# Patient Record
Sex: Female | Born: 1950 | Race: White | Hispanic: No | Smoking: Former smoker
Health system: Southern US, Community
[De-identification: ages and names within clinical notes are randomized; demographics above are authoritative.]

## PROBLEM LIST (undated history)

## (undated) DIAGNOSIS — I4891 Unspecified atrial fibrillation: Secondary | ICD-10-CM

## (undated) DIAGNOSIS — M8580 Other specified disorders of bone density and structure, unspecified site: Secondary | ICD-10-CM

## (undated) DIAGNOSIS — G729 Myopathy, unspecified: Secondary | ICD-10-CM

## (undated) DIAGNOSIS — C50919 Malignant neoplasm of unspecified site of unspecified female breast: Secondary | ICD-10-CM

## (undated) DIAGNOSIS — M3500 Sicca syndrome, unspecified: Secondary | ICD-10-CM

## (undated) DIAGNOSIS — G629 Polyneuropathy, unspecified: Secondary | ICD-10-CM

## (undated) DIAGNOSIS — Z950 Presence of cardiac pacemaker: Secondary | ICD-10-CM

## (undated) DIAGNOSIS — I442 Atrioventricular block, complete: Secondary | ICD-10-CM

## (undated) DIAGNOSIS — Z853 Personal history of malignant neoplasm of breast: Secondary | ICD-10-CM

## (undated) DIAGNOSIS — E785 Hyperlipidemia, unspecified: Secondary | ICD-10-CM

## (undated) DIAGNOSIS — K219 Gastro-esophageal reflux disease without esophagitis: Secondary | ICD-10-CM

## (undated) DIAGNOSIS — E119 Type 2 diabetes mellitus without complications: Secondary | ICD-10-CM

## (undated) HISTORY — PX: PARTIAL HYSTERECTOMY: SHX80

## (undated) HISTORY — PX: BREAST LUMPECTOMY: SHX2

## (undated) HISTORY — DX: Sjogren syndrome, unspecified: M35.00

## (undated) HISTORY — DX: Hyperlipidemia, unspecified: E78.5

## (undated) HISTORY — DX: Polyneuropathy, unspecified: G62.9

## (undated) HISTORY — DX: Type 2 diabetes mellitus without complications: E11.9

## (undated) HISTORY — DX: Atrioventricular block, complete: I44.2

## (undated) HISTORY — DX: Myopathy, unspecified: G72.9

## (undated) HISTORY — DX: Other specified disorders of bone density and structure, unspecified site: M85.80

## (undated) HISTORY — PX: PACEMAKER INSERTION: SHX728

## (undated) HISTORY — DX: Unspecified atrial fibrillation: I48.91

## (undated) HISTORY — DX: Gastro-esophageal reflux disease without esophagitis: K21.9

## (undated) HISTORY — DX: Presence of cardiac pacemaker: Z95.0

## (undated) HISTORY — DX: Personal history of malignant neoplasm of breast: Z85.3

## (undated) HISTORY — DX: Malignant neoplasm of unspecified site of unspecified female breast: C50.919

---

## 2000-09-14 DIAGNOSIS — R55 Syncope and collapse: Secondary | ICD-10-CM | POA: Insufficient documentation

## 2014-11-23 DIAGNOSIS — C50211 Malignant neoplasm of upper-inner quadrant of right female breast: Secondary | ICD-10-CM | POA: Insufficient documentation

## 2015-01-31 DIAGNOSIS — I959 Hypotension, unspecified: Secondary | ICD-10-CM | POA: Insufficient documentation

## 2015-05-02 DIAGNOSIS — Z9221 Personal history of antineoplastic chemotherapy: Secondary | ICD-10-CM | POA: Insufficient documentation

## 2015-05-02 DIAGNOSIS — I519 Heart disease, unspecified: Secondary | ICD-10-CM | POA: Insufficient documentation

## 2020-12-12 DIAGNOSIS — M8580 Other specified disorders of bone density and structure, unspecified site: Secondary | ICD-10-CM | POA: Insufficient documentation

## 2020-12-12 DIAGNOSIS — Z90711 Acquired absence of uterus with remaining cervical stump: Secondary | ICD-10-CM | POA: Insufficient documentation

## 2021-01-17 DIAGNOSIS — Z8616 Personal history of COVID-19: Secondary | ICD-10-CM | POA: Insufficient documentation

## 2021-07-23 ENCOUNTER — Ambulatory Visit: Payer: Self-pay | Admitting: Cardiovascular Disease

## 2021-08-12 ENCOUNTER — Ambulatory Visit (INDEPENDENT_AMBULATORY_CARE_PROVIDER_SITE_OTHER): Payer: Medicare Other | Admitting: Cardiovascular Disease

## 2021-08-12 ENCOUNTER — Encounter: Payer: Self-pay | Admitting: Cardiovascular Disease

## 2021-08-12 ENCOUNTER — Other Ambulatory Visit: Payer: Self-pay

## 2021-08-12 DIAGNOSIS — Z950 Presence of cardiac pacemaker: Secondary | ICD-10-CM | POA: Diagnosis not present

## 2021-08-12 DIAGNOSIS — E782 Mixed hyperlipidemia: Secondary | ICD-10-CM

## 2021-08-12 DIAGNOSIS — E785 Hyperlipidemia, unspecified: Secondary | ICD-10-CM | POA: Insufficient documentation

## 2021-08-12 NOTE — Progress Notes (Signed)
08/12/2021 Dorna Mai   02-10-1951  [id number]  Primary Physician No primary care provider on file. Primary Cardiologist: Lorretta Harp MD Lupe Carney, [place]  HPI:  Sylvia Gonzalez is a 70 y.o. thin and fit appearing divorced Caucasian female mother of 1 son, grandmother to 3 grandchildren all of whom live in [place] that was referred by Dr. Jaclyn Prime, her electrophysiologist, to be established in my practice for ongoing cardiovascular care.  She is retired from being in Scientist, research (life sciences) estate for 35 years.  She lived in [place] and relocated to [place] to be closer to her family.  Her brother-in-law, Jackson Latino, is a patient of mine as well.  Her cardiovascular risk factor profile is notable for hyperlipidemia intolerant to statin therapy.  Her father potentially had stents.  She is never had a heart attack or stroke.  She denies chest pain or shortness of breath.  She is fairly active and walks regularly without symptoms.  She apparently had a pacemaker placed in 2002 by Dr. Reeves Dam for complete heart block as well as a generator change in 2012 and again last month, October 2022.  Her 2D echo performed by her cardiologist 06/03/2021 was essentially normal.   Current Meds  Medication Sig   B Complex Vitamins (B COMPLEX 100 PO) Take by mouth.   BIOTIN PO Take by mouth.   Multiple Vitamins-Minerals (ULTRA FREEDA) TABS Take 1 tablet by mouth daily.   omeprazole (PRILOSEC) 40 MG capsule Take by mouth.   [DISCONTINUED] Benzocaine-Menthol 15-3.6 MG LOZG Take by mouth.   [DISCONTINUED] Calcium Carbonate Antacid 400 MG CHEW Chew by mouth.   [DISCONTINUED] ferrous sulfate 325 (65 FE) MG tablet Take by mouth.     Not on File  Social History   Socioeconomic History   Marital status: Divorced    Spouse name: Not on file   Number of children: Not on file   Years of education: Not on file   Highest education level: Not on file  Occupational History    Not on file  Tobacco Use   Smoking status: Never   Smokeless tobacco: Never  Substance and Sexual Activity   Alcohol use: Not on file    Comment: social   Drug use: Never   Sexual activity: Yes  Other Topics Concern   Not on file  Social History Narrative   Not on file   Social Determinants of Health   Financial Resource Strain: Not on file  Food Insecurity: Not on file  Transportation Needs: Not on file  Physical Activity: Not on file  Stress: Not on file  Social Connections: Not on file  Intimate Partner Violence: Not on file     Review of Systems: General: negative for chills, fever, night sweats or weight changes.  Cardiovascular: negative for chest pain, dyspnea on exertion, edema, orthopnea, palpitations, paroxysmal nocturnal dyspnea or shortness of breath Dermatological: negative for rash Respiratory: negative for cough or wheezing Urologic: negative for hematuria Abdominal: negative for nausea, vomiting, diarrhea, bright red blood per rectum, melena, or hematemesis Neurologic: negative for visual changes, syncope, or dizziness All other systems reviewed and are otherwise negative except as noted above.    Blood pressure 126/68, pulse 66, height 5\' 7"  (1.702 m), weight 140 lb 9.6 oz (63.8 kg), SpO2 97 %.  General appearance: alert and no distress Neck: no adenopathy, no carotid bruit, no JVD, supple, symmetrical, trachea midline, and thyroid not enlarged, symmetric, no tenderness/mass/nodules Lungs:  clear to auscultation bilaterally Heart: regular rate and rhythm, S1, S2 normal, no murmur, click, rub or gallop Extremities: extremities normal, atraumatic, no cyanosis or edema Pulses: 2+ and symmetric Skin: Skin color, texture, turgor normal. No rashes or lesions Neurologic: Grossly normal  EKG sinus rhythm at 66 with left bundle branch block.  I personally reviewed this EKG.  ASSESSMENT AND PLAN:   Hyperlipidemia History of hyperlipidemia intolerant to statin  therapy.  I am going to get a coronary calcium score to help determine how aggressive to be with lipid management.  I am referring her to a Pharm.D. in our practice to discuss PCSK9.  History of permanent cardiac pacemaker placement History of permanent transvenous pacemaker implanted in 2002 for complete heart block by Dr. Rosezella Florida.  She had a GEN change in 2012 and recently last month.  The wound is healed nicely.  She is currently in sinus rhythm.  2D echo performed in September revealed normal LV function with an EF of 53% with a septal wall motion abnormality consistent with pacing.  She is completely asymptomatic.  I am going to refer her to one of our EP doctors to get established for ongoing pacemaker care .     Lorretta Harp MD FACP,FACC,FAHA, [facility] 08/12/2021 10:38 AM

## 2021-08-12 NOTE — Addendum Note (Signed)
Addended by: Beatrix Fetters on: 08/12/2021 10:45 AM   Modules accepted: Orders

## 2021-08-12 NOTE — Assessment & Plan Note (Signed)
History of permanent transvenous pacemaker implanted in 2002 for complete heart block by Dr. Rosezella Florida.  She had a GEN change in 2012 and recently last month.  The wound is healed nicely.  She is currently in sinus rhythm.  2D echo performed in September revealed normal LV function with an EF of 53% with a septal wall motion abnormality consistent with pacing.  She is completely asymptomatic.  I am going to refer her to one of our EP doctors to get established for ongoing pacemaker care .

## 2021-08-12 NOTE — Patient Instructions (Signed)
Medication Instructions:  Your physician recommends that you continue on your current medications as directed. Please refer to the Current Medication list given to you today.  *If you need a refill on your cardiac medications before your next appointment, please call your pharmacy*   Lab Work: Your physician recommends that you have labs drawn today: Lipid/liver profile  If you have labs (blood work) drawn today and your tests are completely normal, you will receive your results only by: Philip (if you have MyChart) OR A paper copy in the mail If you have any lab test that is abnormal or we need to change your treatment, we will call you to review the results.   Testing/Procedures: Dr. Gwenlyn Found has ordered a CT coronary calcium score. This test is done at [address]. This is $99 out of pocket.   Coronary CalciumScan A coronary calcium scan is an imaging test used to look for deposits of calcium and other fatty materials (plaques) in the inner lining of the blood vessels of the heart (coronary arteries). These deposits of calcium and plaques can partly clog and narrow the coronary arteries without producing any symptoms or warning signs. This puts a person at risk for a heart attack. This test can detect these deposits before symptoms develop. Tell a health care provider about: Any allergies you have. All medicines you are taking, including vitamins, herbs, eye drops, creams, and over-the-counter medicines. Any problems you or family members have had with anesthetic medicines. Any blood disorders you have. Any surgeries you have had. Any medical conditions you have. Whether you are pregnant or may be pregnant. What are the risks? Generally, this is a safe procedure. However, problems may occur, including: Harm to a pregnant woman and her unborn baby. This test involves the use of radiation. Radiation exposure can be dangerous to a pregnant woman and her unborn  baby. If you are pregnant, you generally should not have this procedure done. Slight increase in the risk of cancer. This is because of the radiation involved in the test. What happens before the procedure? No preparation is needed for this procedure. What happens during the procedure? You will undress and remove any jewelry around your neck or chest. You will put on a hospital gown. Sticky electrodes will be placed on your chest. The electrodes will be connected to an electrocardiogram (ECG) machine to record a tracing of the electrical activity of your heart. A CT scanner will take pictures of your heart. During this time, you will be asked to lie still and hold your breath for 2-3 seconds while a picture of your heart is being taken. The procedure may vary among health care providers and hospitals. What happens after the procedure? You can get dressed. You can return to your normal activities. It is up to you to get the results of your test. Ask your health care provider, or the department that is doing the test, when your results will be ready. Summary A coronary calcium scan is an imaging test used to look for deposits of calcium and other fatty materials (plaques) in the inner lining of the blood vessels of the heart (coronary arteries). Generally, this is a safe procedure. Tell your health care provider if you are pregnant or may be pregnant. No preparation is needed for this procedure. A CT scanner will take pictures of your heart. You can return to your normal activities after the scan is done. This information is not intended to  replace advice given to you by your health care provider. Make sure you discuss any questions you have with your health care provider. Document Released: 02/27/2008 Document Revised: 07/20/2016 Document Reviewed: 07/20/2016 Elsevier Interactive Patient Education  2017 [place]: At [facility], you and your health needs are our  priority.  As part of our continuing mission to provide you with exceptional heart care, we have created designated Provider Care Teams.  These Care Teams include your primary Cardiologist (physician) and Advanced Practice Providers (APPs -  Physician Assistants and Nurse Practitioners) who all work together to provide you with the care you need, when you need it.  We recommend signing up for the patient portal called "MyChart".  Sign up information is provided on this After Visit Summary.  MyChart is used to connect with patients for Virtual Visits (Telemedicine).  Patients are able to view lab/test results, encounter notes, upcoming appointments, etc.  Non-urgent messages can be sent to your provider as well.   To learn more about what you can do with MyChart, go to [email or web address].    Your next appointment:   12 month(s)  The format for your next appointment:   In Person  Provider:   Quay Burow, MD  Other Instructions Pt needs an appointment with PharmD to discuss lipid management.

## 2021-08-12 NOTE — Assessment & Plan Note (Signed)
History of hyperlipidemia intolerant to statin therapy.  I am going to get a coronary calcium score to help determine how aggressive to be with lipid management.  I am referring her to a Pharm.D. in our practice to discuss PCSK9.

## 2021-08-13 LAB — LIPID PANEL
Chol/HDL Ratio: 7.2 ratio — ABNORMAL HIGH (ref 0.0–4.4)
Cholesterol, Total: 315 mg/dL — ABNORMAL HIGH (ref 100–199)
HDL: 44 mg/dL (ref >39)
LDL Chol Calc (NIH): 196 mg/dL — ABNORMAL HIGH (ref 0–99)
Triglycerides: 370 mg/dL — ABNORMAL HIGH (ref 0–149)
VLDL Cholesterol Cal: 75 mg/dL — ABNORMAL HIGH (ref 5–40)

## 2021-08-13 LAB — HEPATIC FUNCTION PANEL
ALT: 43 IU/L — ABNORMAL HIGH (ref 0–32)
AST: 33 IU/L (ref 0–40)
Albumin: 4.9 g/dL — ABNORMAL HIGH (ref 3.7–4.7)
Alkaline Phosphatase: 88 IU/L (ref 44–121)
Bilirubin Total: 0.3 mg/dL (ref 0.0–1.2)
Bilirubin, Direct: <0.1 mg/dL (ref 0.00–0.40)
Total Protein: 7.3 g/dL (ref 6.0–8.5)

## 2021-08-13 LAB — TSH+FREE T4
Free T4: 1.04 ng/dL (ref 0.82–1.77)
TSH: 2.09 u[IU]/mL (ref 0.450–4.500)

## 2021-09-03 ENCOUNTER — Ambulatory Visit (INDEPENDENT_AMBULATORY_CARE_PROVIDER_SITE_OTHER)
Admission: RE | Admit: 2021-09-03 | Discharge: 2021-09-03 | Disposition: A | Discharge status: Home / Self Care | Payer: Self-pay | Source: Ambulatory Visit | Attending: Cardiovascular Disease | Admitting: Cardiovascular Disease

## 2021-09-03 ENCOUNTER — Telehealth: Payer: Self-pay | Admitting: Pharmacist

## 2021-09-03 ENCOUNTER — Ambulatory Visit (INDEPENDENT_AMBULATORY_CARE_PROVIDER_SITE_OTHER): Payer: Medicare Other | Admitting: Pharmacist

## 2021-09-03 ENCOUNTER — Other Ambulatory Visit: Payer: Self-pay

## 2021-09-03 VITALS — BP 124/74 | HR 68 | Resp 16 | Ht 67.0 in | Wt 143.0 lb

## 2021-09-03 DIAGNOSIS — I779 Disorder of arteries and arterioles, unspecified: Secondary | ICD-10-CM | POA: Insufficient documentation

## 2021-09-03 DIAGNOSIS — E782 Mixed hyperlipidemia: Secondary | ICD-10-CM

## 2021-09-03 DIAGNOSIS — I2510 Atherosclerotic heart disease of native coronary artery without angina pectoris: Secondary | ICD-10-CM | POA: Insufficient documentation

## 2021-09-03 DIAGNOSIS — E7800 Pure hypercholesterolemia, unspecified: Secondary | ICD-10-CM | POA: Diagnosis not present

## 2021-09-03 DIAGNOSIS — K222 Esophageal obstruction: Secondary | ICD-10-CM | POA: Insufficient documentation

## 2021-09-03 DIAGNOSIS — I447 Left bundle-branch block, unspecified: Secondary | ICD-10-CM | POA: Insufficient documentation

## 2021-09-03 DIAGNOSIS — H9319 Tinnitus, unspecified ear: Secondary | ICD-10-CM | POA: Insufficient documentation

## 2021-09-03 DIAGNOSIS — K219 Gastro-esophageal reflux disease without esophagitis: Secondary | ICD-10-CM | POA: Insufficient documentation

## 2021-09-03 DIAGNOSIS — Z853 Personal history of malignant neoplasm of breast: Secondary | ICD-10-CM | POA: Insufficient documentation

## 2021-09-03 DIAGNOSIS — I480 Paroxysmal atrial fibrillation: Secondary | ICD-10-CM | POA: Insufficient documentation

## 2021-09-03 NOTE — Progress Notes (Signed)
Patient ID: Sylvia Gonzalez                 DOB: 1950-10-18                    MRN: [id number]     HPI: Solimar Maiden is a 70 y.o. female patient referred to lipid clinic by Dr Gwenlyn Found. PMH is significant for CAD, PAF, and HLD with statin intolerance. Recently moved here from [facility] and was seen by Dr Gwenlyn Found on 08/12/2021.  Had cardiac CT performed this morning however results are not back yet.  Patient presents today in good spirits.  Reports she has always had elevated LDL but does not know of any significant family history. Cardiologist in [place] was working on starting her on Kersey therapy but it did not occur before she moved to [place].    Has tried rosuvastatin, atorvastatin, and at least one other statin which name she does not remember.  All caused severe myalgias.    Reports she previously did most of her eating at home However since moving she has been eating out more.  Starts new Medicare plan in January 2023.  Current Medications: N/A  Intolerances:  Atorvastatin Rosuvastatin  Risk Factors: HLD, CAD  LDL goal: <70  Labs: TC 315, Trigs 370, HDL 44, LDL 196 (08/12/21 - not on any lipid lowering therapy)  No past medical history on file.  Current Outpatient Medications on File Prior to Visit  Medication Sig Dispense Refill   B Complex Vitamins (B COMPLEX 100 PO) Take by mouth.     BIOTIN PO Take by mouth.     Multiple Vitamins-Minerals (ULTRA FREEDA) TABS Take 1 tablet by mouth daily.     omeprazole (PRILOSEC) 40 MG capsule Take by mouth.     No current facility-administered medications on file prior to visit.    Not on File  Assessment/Plan:  1. Hyperlipidemia - Patient LDL 196 which is above goal of <70.  CAC scoring pending.  Unfortunately is intolerant to statins. Would not be an ideal candidate for Zetia at this time since she needs greater than a 50% reduction in LDL.  Recommend PCSK9i.  Using demo pen, educated patient on mechanism of action, storage,  site selection, administration, and possible adverse effects.  Patient was able to demonstrate in room using demo pen.  Requests PA completed when new Medicare plan becomes active. Recheck lipid panel in 2-3 months.  Karren Cobble, PharmD, BCACP, [place], [place], [place], [place], [address] Phone: [phone], Fax: [phone]

## 2021-09-03 NOTE — Telephone Encounter (Signed)
Please complete prior authorization for:  Name of medication, dose, and frequency Repatha 140mg  sq q 14 days or Praluent 75 mg sq q 14 days  Lab Orders Requested? yes  Which labs? Lipid panel  Estimated date for labs to be scheduled 2-3 months  Does patient need activated copay card? No  Please do PA after September 14 2021

## 2021-09-03 NOTE — Patient Instructions (Addendum)
It was nice meeting you today!  We would like to lower your LDL (bad cholesterol) to less than 70  We will start you on a new medication called Repatha or Praluent which you will inject once every 2 weeks  Once you start the medication we will recheck your cholesterol in 2-3 months  Please bring in your updated insurance card when it is active and we will complete the prior authorization for you and contact you when it is complete  Please give your insurance card to [facility]  Please give [place] a call with any questions!  Karren Cobble, PharmD, BCACP, [place], [place], [place], [place], [address] Phone: [phone], Fax: [phone]

## 2021-09-16 NOTE — Addendum Note (Signed)
Addended by: Allean Found on: 09/16/2021 08:56 AM   Modules accepted: Orders

## 2021-09-16 NOTE — Telephone Encounter (Signed)
Praluent pa submitted to cmm, lipi panel ordered, will contact pt when determination from insurance is made

## 2021-09-23 ENCOUNTER — Ambulatory Visit: Payer: Medicare HMO | Admitting: Cardiology

## 2021-09-23 ENCOUNTER — Other Ambulatory Visit: Payer: Self-pay

## 2021-09-23 ENCOUNTER — Encounter: Payer: Self-pay | Admitting: Cardiology

## 2021-09-23 VITALS — BP 116/66 | HR 70 | Ht 67.0 in | Wt 142.0 lb

## 2021-09-23 DIAGNOSIS — Z950 Presence of cardiac pacemaker: Secondary | ICD-10-CM | POA: Diagnosis not present

## 2021-09-23 DIAGNOSIS — I442 Atrioventricular block, complete: Secondary | ICD-10-CM | POA: Diagnosis not present

## 2021-09-23 LAB — PACEMAKER DEVICE OBSERVATION

## 2021-09-23 MED ORDER — PRALUENT 150 MG/ML ~~LOC~~ SOAJ: 150.0000 mg | SUBCUTANEOUS | 11 refills | Status: DC

## 2021-09-23 NOTE — Progress Notes (Signed)
Electrophysiology Office Note:    Date:  09/23/2021   ID:  Sylvia Gonzalez, DOB [birth year], MRN [id number]  PCP:  Michael Boston, MD  [facility] Cardiologist:  None  [facility] Electrophysiologist:  Vickie Epley, MD   Referring MD: Lorretta Harp, MD   Chief Complaint: Pacemaker  History of Present Illness:    Sylvia Gonzalez is a 71 y.o. female who presents for an evaluation of pacemaker at the request of Dr. Gwenlyn Found. Their medical history includes coronary artery disease, hyperlipidemia with statin intolerance.  She recently moved here from [facility].  In [place], she is not remotely monitored because of trouble with her transmitter. Today she tells me she has had no problems with her pacemaker.  The leads were originally implanted 2002 with a subsequent generator change in October 2022.    History reviewed. No pertinent past medical history.  History reviewed. No pertinent surgical history.  Current Medications: Current Meds  Medication Sig   Alirocumab (PRALUENT) 150 MG/ML SOAJ Inject 150 mg into the skin every 14 (fourteen) days.   B Complex Vitamins (B COMPLEX 100 PO) Take by mouth.   BIOTIN PO Take by mouth.   Multiple Vitamin (MULTIVITAMIN) capsule Take 1 capsule by mouth daily.   Multiple Vitamins-Minerals (HAIR SKIN AND NAILS FORMULA) TABS Take 2 tablets by mouth daily.   omeprazole (PRILOSEC) 40 MG capsule Take by mouth.   VITAMIN D, CHOLECALCIFEROL, PO Take 1 tablet by mouth daily.   Vitamin E 100 units TABS Take 1 tablet by mouth daily.     Allergies:   Hydrocodone-acetaminophen, Atorvastatin, Dopamine, and Rosuvastatin   Social History   Socioeconomic History   Marital status: Divorced    Spouse name: Not on file   Number of children: Not on file   Years of education: Not on file   Highest education level: Not on file  Occupational History   Not on file  Tobacco Use   Smoking status: Never   Smokeless tobacco: Never  Substance and  Sexual Activity   Alcohol use: Not on file    Comment: social   Drug use: Never   Sexual activity: Yes  Other Topics Concern   Not on file  Social History Narrative   Not on file   Social Determinants of Health   Financial Resource Strain: Not on file  Food Insecurity: Not on file  Transportation Needs: Not on file  Physical Activity: Not on file  Stress: Not on file  Social Connections: Not on file     Family History: The patient's family history is not on file.  ROS:   Please see the history of present illness.    All other systems reviewed and are negative.  EKGs/Labs/Other Studies Reviewed:    The following studies were reviewed today:  September 23, 2021 in clinic device interrogation personally reviewed Lead parameter stable with an elevated right ventricular lead capture threshold at 2.5 at 1 ms Battery longevity 8 to 11 years Ventricular pacing 10% Atrially pacing 4.6% AV delays are set at 200/170 but the underlying rhythm shows intact AV conduction but with a first-degree AV delay. Reprogrammed her AV delays to activate MVP in an effort to minimize ventricular pacing  EKG:  The ekg ordered today demonstrates sinus rhythm, QRS duration 122 ms   Recent Labs: 08/12/2021: ALT 43; TSH 2.090  Recent Lipid Panel    Component Value Date/Time   CHOL 315 (H) 08/12/2021 1052   TRIG 370 (H) 08/12/2021  1052   HDL 44 08/12/2021 1052   CHOLHDL 7.2 (H) 08/12/2021 1052   LDLCALC 196 (H) 08/12/2021 1052    Physical Exam:    VS:  BP 116/66    Pulse 70    Ht 5\' 7"  (1.702 m)    Wt 142 lb (64.4 kg)    SpO2 97%    BMI 22.24 kg/m     Wt Readings from Last 3 Encounters:  09/23/21 142 lb (64.4 kg)  09/03/21 143 lb (64.9 kg)  08/12/21 140 lb 9.6 oz (63.8 kg)     GEN:  Well nourished, well developed in no acute distress HEENT: Normal NECK: No JVD; No carotid bruits LYMPHATICS: No lymphadenopathy CARDIAC: RRR, no murmurs, rubs, gallops.  Pacemaker pocket  well-healed RESPIRATORY:  Clear to auscultation without rales, wheezing or rhonchi  ABDOMEN: Soft, non-tender, non-distended MUSCULOSKELETAL:  No edema; No deformity  SKIN: Warm and dry NEUROLOGIC:  Alert and oriented x 3 PSYCHIATRIC:  Normal affect       ASSESSMENT:    1. History of permanent cardiac pacemaker placement   2. Complete heart block (HCC)    PLAN:    In order of problems listed above:  The patient presents for establish care for her permanent pacemaker that was implanted 20 years ago in [place].  The device is functioning appropriately.  I did reprogram her AV delays today to minimize ventricular pacing.  Of note, the right ventricular pacing threshold is elevated at 2.5 at 1 ms.  We will establish in our clinic.  I will have the representative from [facility] reach out to the patient to discuss her remote monitoring equipment.  She will follow-up in our clinic in 9 months or sooner as needed.     Medication Adjustments/Labs and Tests Ordered: Current medicines are reviewed at length with the patient today.  Concerns regarding medicines are outlined above.  No orders of the defined types were placed in this encounter.  No orders of the defined types were placed in this encounter.    Signed, Hilton Cork. Quentin Ore, MD, [facility], [facility] 09/23/2021 9:30 PM    Electrophysiology [facility]

## 2021-09-23 NOTE — Telephone Encounter (Signed)
Lmom the pt that they were approved for the praluent, rxsent, instructed the pt to complete fasting labs post 4th dose and to call me to obtain their healthwell grant information Pharmacy Card CARD NO. [id number]   CARD STATUS Active   BIN [id number]   PCN [id number]   PC GROUP [id number]   HELP DESK [phone]   PROVIDER PDMI   PROCESSOR PDMI

## 2021-09-23 NOTE — Addendum Note (Signed)
Addended by: Allean Found on: 09/23/2021 03:44 PM   Modules accepted: Orders

## 2021-09-23 NOTE — Telephone Encounter (Signed)
Pt called to obtain healthwell grant information and it was provided. Pt voiced gratitude and understanding

## 2021-09-23 NOTE — Patient Instructions (Addendum)
Medication Instructions:  Your physician recommends that you continue on your current medications as directed. Please refer to the Current Medication list given to you today. *If you need a refill on your cardiac medications before your next appointment, please call your pharmacy*  Lab Work: None. If you have labs (blood work) drawn today and your tests are completely normal, you will receive your results only by: MyChart Message (if you have MyChart) OR A paper copy in the mail If you have any lab test that is abnormal or we need to change your treatment, we will call you to review the results.  Testing/Procedures: None.  Follow-Up: At [facility], you and your health needs are our priority.  As part of our continuing mission to provide you with exceptional heart care, we have created designated Provider Care Teams.  These Care Teams include your primary Cardiologist (physician) and Advanced Practice Providers (APPs -  Physician Assistants and Nurse Practitioners) who all work together to provide you with the care you need, when you need it.  Your physician wants you to follow-up in: 12 months with Cameron Lambert, MD     You will receive a reminder letter in the mail two months in advance. If you don't receive a letter, please call our office to schedule the follow-up appointment.  We recommend signing up for the patient portal called "MyChart".  Sign up information is provided on this After Visit Summary.  MyChart is used to connect with patients for Virtual Visits (Telemedicine).  Patients are able to view lab/test results, encounter notes, upcoming appointments, etc.  Non-urgent messages can be sent to your provider as well.   To learn more about what you can do with MyChart, go to [email or web address].    Any Other Special Instructions Will Be Listed Below (If Applicable).         

## 2021-09-29 LAB — CUP PACEART INCLINIC DEVICE CHECK
Battery Remaining Longevity: 135 mo
Battery Voltage: 3.02 V
Brady Statistic RA Percent Paced: 4.6 %
Brady Statistic RV Percent Paced: 10 %
Date Time Interrogation Session: 20230110160700
Implantable Pulse Generator Implant Date: 20221017
Lead Channel Impedance Value: 412.5 Ohm
Lead Channel Impedance Value: 725 Ohm
Lead Channel Pacing Threshold Amplitude: 0.75 V
Lead Channel Pacing Threshold Amplitude: 0.75 V
Lead Channel Pacing Threshold Amplitude: 2.25 V
Lead Channel Pacing Threshold Amplitude: 2.25 V
Lead Channel Pacing Threshold Pulse Width: 0.5 ms
Lead Channel Pacing Threshold Pulse Width: 0.5 ms
Lead Channel Pacing Threshold Pulse Width: 1 ms
Lead Channel Pacing Threshold Pulse Width: 1 ms
Lead Channel Sensing Intrinsic Amplitude: 2 mV
Lead Channel Sensing Intrinsic Amplitude: 2 mV
Lead Channel Setting Pacing Amplitude: 1.75 V
Lead Channel Setting Pacing Amplitude: 2.875
Lead Channel Setting Pacing Pulse Width: 1 ms
Lead Channel Setting Sensing Sensitivity: 1 mV
Pulse Gen Model: 2272
Pulse Gen Serial Number: 6462631

## 2021-11-19 DIAGNOSIS — Z853 Personal history of malignant neoplasm of breast: Secondary | ICD-10-CM | POA: Diagnosis not present

## 2021-11-19 DIAGNOSIS — Z1231 Encounter for screening mammogram for malignant neoplasm of breast: Secondary | ICD-10-CM | POA: Diagnosis not present

## 2021-11-28 ENCOUNTER — Other Ambulatory Visit: Payer: Self-pay

## 2021-11-28 DIAGNOSIS — E7800 Pure hypercholesterolemia, unspecified: Secondary | ICD-10-CM | POA: Diagnosis not present

## 2021-11-28 LAB — LIPID PANEL
Chol/HDL Ratio: 4.2 ratio (ref 0.0–4.4)
Cholesterol, Total: 175 mg/dL (ref 100–199)
HDL: 42 mg/dL (ref >39)
LDL Chol Calc (NIH): 94 mg/dL (ref 0–99)
Triglycerides: 228 mg/dL — ABNORMAL HIGH (ref 0–149)
VLDL Cholesterol Cal: 39 mg/dL (ref 5–40)

## 2021-12-08 DIAGNOSIS — R928 Other abnormal and inconclusive findings on diagnostic imaging of breast: Secondary | ICD-10-CM | POA: Diagnosis not present

## 2021-12-08 DIAGNOSIS — R921 Mammographic calcification found on diagnostic imaging of breast: Secondary | ICD-10-CM | POA: Diagnosis not present

## 2021-12-08 DIAGNOSIS — R922 Inconclusive mammogram: Secondary | ICD-10-CM | POA: Diagnosis not present

## 2021-12-22 DIAGNOSIS — D242 Benign neoplasm of left breast: Secondary | ICD-10-CM | POA: Diagnosis not present

## 2021-12-22 DIAGNOSIS — R921 Mammographic calcification found on diagnostic imaging of breast: Secondary | ICD-10-CM | POA: Diagnosis not present

## 2022-02-10 DIAGNOSIS — R7989 Other specified abnormal findings of blood chemistry: Secondary | ICD-10-CM | POA: Diagnosis not present

## 2022-02-10 DIAGNOSIS — E785 Hyperlipidemia, unspecified: Secondary | ICD-10-CM | POA: Diagnosis not present

## 2022-02-10 DIAGNOSIS — M8580 Other specified disorders of bone density and structure, unspecified site: Secondary | ICD-10-CM | POA: Diagnosis not present

## 2022-02-24 DIAGNOSIS — Z0000 Encounter for general adult medical examination without abnormal findings: Secondary | ICD-10-CM | POA: Diagnosis not present

## 2022-02-24 DIAGNOSIS — E785 Hyperlipidemia, unspecified: Secondary | ICD-10-CM | POA: Diagnosis not present

## 2022-02-24 DIAGNOSIS — G729 Myopathy, unspecified: Secondary | ICD-10-CM | POA: Diagnosis not present

## 2022-02-24 DIAGNOSIS — M3500 Sicca syndrome, unspecified: Secondary | ICD-10-CM | POA: Diagnosis not present

## 2022-02-24 DIAGNOSIS — M8580 Other specified disorders of bone density and structure, unspecified site: Secondary | ICD-10-CM | POA: Diagnosis not present

## 2022-02-24 DIAGNOSIS — I480 Paroxysmal atrial fibrillation: Secondary | ICD-10-CM | POA: Diagnosis not present

## 2022-02-24 DIAGNOSIS — K219 Gastro-esophageal reflux disease without esophagitis: Secondary | ICD-10-CM | POA: Diagnosis not present

## 2022-02-24 DIAGNOSIS — Z853 Personal history of malignant neoplasm of breast: Secondary | ICD-10-CM | POA: Diagnosis not present

## 2022-02-24 DIAGNOSIS — Z1159 Encounter for screening for other viral diseases: Secondary | ICD-10-CM | POA: Diagnosis not present

## 2022-02-24 DIAGNOSIS — R7303 Prediabetes: Secondary | ICD-10-CM | POA: Diagnosis not present

## 2022-02-24 DIAGNOSIS — G609 Hereditary and idiopathic neuropathy, unspecified: Secondary | ICD-10-CM | POA: Diagnosis not present

## 2022-02-24 DIAGNOSIS — Z1339 Encounter for screening examination for other mental health and behavioral disorders: Secondary | ICD-10-CM | POA: Diagnosis not present

## 2022-02-24 DIAGNOSIS — Z1331 Encounter for screening for depression: Secondary | ICD-10-CM | POA: Diagnosis not present

## 2022-03-09 DIAGNOSIS — G729 Myopathy, unspecified: Secondary | ICD-10-CM | POA: Diagnosis not present

## 2022-03-09 DIAGNOSIS — R42 Dizziness and giddiness: Secondary | ICD-10-CM | POA: Diagnosis not present

## 2022-03-09 DIAGNOSIS — B001 Herpesviral vesicular dermatitis: Secondary | ICD-10-CM | POA: Diagnosis not present

## 2022-03-09 DIAGNOSIS — M542 Cervicalgia: Secondary | ICD-10-CM | POA: Diagnosis not present

## 2022-03-09 DIAGNOSIS — I480 Paroxysmal atrial fibrillation: Secondary | ICD-10-CM | POA: Diagnosis not present

## 2022-03-09 DIAGNOSIS — M3500 Sicca syndrome, unspecified: Secondary | ICD-10-CM | POA: Diagnosis not present

## 2022-04-21 ENCOUNTER — Encounter: Payer: Self-pay | Admitting: Gastroenterology

## 2022-04-27 DIAGNOSIS — D1801 Hemangioma of skin and subcutaneous tissue: Secondary | ICD-10-CM | POA: Diagnosis not present

## 2022-04-27 DIAGNOSIS — X32XXXA Exposure to sunlight, initial encounter: Secondary | ICD-10-CM | POA: Diagnosis not present

## 2022-04-27 DIAGNOSIS — C44612 Basal cell carcinoma of skin of right upper limb, including shoulder: Secondary | ICD-10-CM | POA: Diagnosis not present

## 2022-04-27 DIAGNOSIS — D225 Melanocytic nevi of trunk: Secondary | ICD-10-CM | POA: Diagnosis not present

## 2022-04-27 DIAGNOSIS — L821 Other seborrheic keratosis: Secondary | ICD-10-CM | POA: Diagnosis not present

## 2022-04-27 DIAGNOSIS — L570 Actinic keratosis: Secondary | ICD-10-CM | POA: Diagnosis not present

## 2022-05-20 DIAGNOSIS — G629 Polyneuropathy, unspecified: Secondary | ICD-10-CM | POA: Insufficient documentation

## 2022-05-20 DIAGNOSIS — Z923 Personal history of irradiation: Secondary | ICD-10-CM | POA: Insufficient documentation

## 2022-05-21 ENCOUNTER — Ambulatory Visit (INDEPENDENT_AMBULATORY_CARE_PROVIDER_SITE_OTHER): Payer: Medicare HMO | Admitting: Gastroenterology

## 2022-05-21 ENCOUNTER — Encounter: Payer: Self-pay | Admitting: Gastroenterology

## 2022-05-21 VITALS — BP 124/68 | HR 62 | Ht 67.0 in | Wt 139.0 lb

## 2022-05-21 DIAGNOSIS — R1319 Other dysphagia: Secondary | ICD-10-CM | POA: Diagnosis not present

## 2022-05-21 NOTE — Progress Notes (Signed)
[facility] Consult Note:  History: Sylvia Gonzalez 05/21/2022  Referring provider: Michael Boston, MD  Reason for consult/chief complaint: Dysphagia (Patient states she watches what she eats)   Subjective  HPI: Sylvia Gonzalez was referred by her primary care provider for dysphagia.  She reports episodic dysphagia with 2 prior endoscopic dilations by her GI physician in [place] prior to moving to this area. She recently had an episode of what sounds like a brief food impaction while eating in a restaurant, after which she contacted Dr. Jacalyn Gonzalez and was scheduled to see [place].  Limited had been taking omeprazole daily for years, then within about the last 2 months decided to wean off it.  She is now just taking it occasionally if she has heartburn.  She was doing well until the episode 4 to 6 weeks ago where everything got stuck so she started her breakfast, nothing would pass and she had to go to the bathroom bring her back.  Since then she has been much more careful with no recurrent problems. She had 2 upper endoscopies in [place] before and also what sounds like a barium study.  Appetite has been good and weight stable. [name] reports a normal colonoscopy in the last several years. ROS:  Review of Systems  Constitutional:  Negative for appetite change and unexpected weight change.  HENT:  Negative for mouth sores and voice change.   Eyes:  Negative for pain and redness.  Respiratory:  Negative for cough and shortness of breath.   Cardiovascular:  Negative for chest pain and palpitations.  Genitourinary:  Negative for dysuria and hematuria.  Musculoskeletal:  Negative for arthralgias and myalgias.  Skin:  Negative for pallor and rash.  Neurological:  Negative for weakness and headaches.       Neuropathy from chemotherapy  Hematological:  Negative for adenopathy.   Also positional dizziness  Past Medical History: Past Medical History:  Diagnosis Date   Atrial fibrillation  ([place])    Breast cancer ([place])    Complete heart block (HCC)    GERD (gastroesophageal reflux disease)    History of breast cancer    Hyperlipidemia    Myopathy    Osteopenia    Pacemaker    Peripheral neuropathy    Sicca syndrome [facility])    Nov 2022 Cardiology office note Sylvia Gonzalez) has the following: "HPI:  Sylvia Gonzalez is a 71 y.o. thin and fit appearing divorced Caucasian female mother of 1 son, grandmother to 3 grandchildren all of whom live in [place] that was referred by Sylvia Gonzalez, her electrophysiologist, to be established in my practice for ongoing cardiovascular care.  She is retired from being in Scientist, research (life sciences) estate for 35 years.  She lived in [place] and relocated to [place] to be closer to her family.  Her brother-in-law, Sylvia Gonzalez, is a patient of mine as well.  Her cardiovascular risk factor profile is notable for hyperlipidemia intolerant to statin therapy.  Her father potentially had stents.  She is never had a heart attack or stroke.  She denies chest pain or shortness of breath.  She is fairly active and walks regularly without symptoms.  She apparently had a pacemaker placed in 2002 by Dr. Reeves Dam for complete heart block as well as a generator change in 2012 and again last month, October 2022.  Her 2D echo performed by her cardiologist 06/03/2021 was essentially normal."     Past Surgical History: Past Surgical History:  Procedure Laterality Date  BREAST LUMPECTOMY     PACEMAKER INSERTION     PARTIAL HYSTERECTOMY       Family History: Family History  Problem Relation Age of Onset   Dementia Mother    Diabetes Father    Breast cancer Sister    Diabetes Paternal Grandfather    Colon cancer Neg Hx    Stomach cancer Neg Hx    Esophageal cancer Neg Hx    Colon polyps Neg Hx     Social History: Social History   Socioeconomic History   Marital status: Divorced    Spouse name: Not on file   Number of children: 1   Years  of education: Not on file   Highest education level: Not on file  Occupational History   Occupation: retired  Tobacco Use   Smoking status: Former    Types: Cigarettes   Smokeless tobacco: Never  Scientific laboratory technician Use: Never used  Substance and Sexual Activity   Alcohol use: Yes    Comment: social   Drug use: Never   Sexual activity: Yes  Other Topics Concern   Not on file  Social History Narrative   Not on file   Social Determinants of Health   Financial Resource Strain: Not on file  Food Insecurity: Not on file  Transportation Needs: Not on file  Physical Activity: Not on file  Stress: Not on file  Social Connections: Not on file    Allergies: Allergies  Allergen Reactions   Hydrocodone-Acetaminophen     Other reaction(s): Unknown   Atorvastatin     Other reaction(s): Myalgia   Dopamine Nausea And Vomiting    Other reaction(s): Unknown   Rosuvastatin     Other reaction(s): Myalgia    Outpatient Meds: Current Outpatient Medications  Medication Sig Dispense Refill   Alirocumab (PRALUENT) 150 MG/ML SOAJ Inject 150 mg into the skin every 14 (fourteen) days. 2 mL 11   CALCIUM PO Take by mouth.     Multiple Vitamin (MULTIVITAMIN) capsule Take 1 capsule by mouth daily.     Multiple Vitamins-Minerals (HAIR SKIN AND NAILS FORMULA) TABS Take 2 tablets by mouth daily.     omeprazole (PRILOSEC) 40 MG capsule Take by mouth.     Vitamin E 100 units TABS Take 1 tablet by mouth daily.     B Complex Vitamins (B COMPLEX 100 PO) Take by mouth. (Patient not taking: Reported on 05/21/2022)     BIOTIN PO Take by mouth. (Patient not taking: Reported on 05/21/2022)     VITAMIN D, CHOLECALCIFEROL, PO Take 1 tablet by mouth daily. (Patient not taking: Reported on 05/21/2022)     No current facility-administered medications for this visit.      ___________________________________________________________________ Objective   Exam:  BP 124/68   Pulse 62   Ht '5\' 7"'$  (1.702 m)   Wt  139 lb (63 kg)   SpO2 97%   BMI 21.77 kg/m  Wt Readings from Last 3 Encounters:  05/21/22 139 lb (63 kg)  09/23/21 142 lb (64.4 kg)  09/03/21 143 lb (64.9 kg)    General: Well-appearing, normal vocal quality.  Normal muscle mass Eyes: sclera anicteric, no redness ENT: oral mucosa moist without lesions, no cervical or supraclavicular lymphadenopathy CV: Regular without murmur, no JVD, no peripheral edema.  Pacer left upper chest wall Resp: clear to auscultation bilaterally, normal RR and effort noted GI: soft, no tenderness, with active bowel sounds. No guarding or palpable organomegaly noted. Skin; warm and dry, no  rash or jaundice noted Neuro: awake, alert and oriented x 3. Normal gross motor function and fluent speech  Labs: Normal CBC, CMP and TSH on 02/10/2022  Assessment: Encounter Diagnosis  Name Primary?   Esophageal dysphagia Yes    Possible history of stricture and/or motility disorder, reported previous endoscopic dilation.  She was doing well until her recent episode of what sounds and a brief food impaction. There may not be much underlying GERD since she has been able to wean off regular use of PPI lately. Plan:  Upper endoscopy with possible dilation.  Rule out stricture, ring, achalasia. She was agreeable after discussion of procedure and risks.  The benefits and risks of the planned procedure were described in detail with the patient or (when appropriate) their health care proxy.  Risks were outlined as including, but not limited to, bleeding, infection, perforation, adverse medication reaction leading to cardiac or pulmonary decompensation, pancreatitis (if ERCP).  The limitation of incomplete mucosal visualization was also discussed.  No guarantees or warranties were given.   Thank you for the courtesy of this consult.  Please call me with any questions or concerns.  Nelida Meuse III  CC: Referring provider noted above

## 2022-05-21 NOTE — Patient Instructions (Signed)
_______________________________________________________  If you are age 72 or older, your body mass index should be between 23-30. Your Body mass index is 21.77 kg/m. If this is out of the aforementioned range listed, please consider follow up with your Primary Care Provider.  If you are age 57 or younger, your body mass index should be between 19-25. Your Body mass index is 21.77 kg/m. If this is out of the aformentioned range listed, please consider follow up with your Primary Care Provider.   ________________________________________________________  The [facility] providers would like to encourage you to use [facility] to communicate with providers for non-urgent requests or questions.  Due to long hold times on the telephone, sending your provider a message by [facility] may be a faster and more efficient way to get a response.  Please allow 48 business hours for a response.  Please remember that this is for non-urgent requests.  _______________________________________________________  Dennis Bast have been scheduled for an endoscopy. Please follow written instructions given to you at your visit today. If you use inhalers (even only as needed), please bring them with you on the day of your procedure.  Due to recent changes in healthcare laws, you may see the results of your imaging and laboratory studies on MyChart before your provider has had a chance to review them.  We understand that in some cases there may be results that are confusing or concerning to you. Not all laboratory results come back in the same time frame and the provider may be waiting for multiple results in order to interpret others.  Please give [place] 48 hours in order for your provider to thoroughly review all the results before contacting the office for clarification of your results.   It was a pleasure to see you today!  Thank you for trusting me with your gastrointestinal care!

## 2022-05-26 ENCOUNTER — Ambulatory Visit (AMBULATORY_SURGERY_CENTER): Payer: Medicare HMO | Admitting: Gastroenterology

## 2022-05-26 ENCOUNTER — Encounter: Payer: Self-pay | Admitting: Gastroenterology

## 2022-05-26 VITALS — BP 143/62 | HR 62 | Temp 97.8°F | Resp 16 | Ht 67.0 in | Wt 139.0 lb

## 2022-05-26 DIAGNOSIS — K317 Polyp of stomach and duodenum: Secondary | ICD-10-CM

## 2022-05-26 DIAGNOSIS — K31819 Angiodysplasia of stomach and duodenum without bleeding: Secondary | ICD-10-CM | POA: Diagnosis not present

## 2022-05-26 DIAGNOSIS — R1319 Other dysphagia: Secondary | ICD-10-CM

## 2022-05-26 MED ORDER — SODIUM CHLORIDE 0.9 % IV SOLN: 500.0000 mL | Freq: Once | INTRAVENOUS | Status: DC

## 2022-05-26 NOTE — Progress Notes (Signed)
No changes to clinical history since GI office visit on 05/21/22.  The patient is appropriate for an endoscopic procedure in the ambulatory setting.  - Wilfrid Lund, MD

## 2022-05-26 NOTE — Op Note (Signed)
[facility] Patient Name: Sylvia Gonzalez Procedure Date: 05/26/2022 7:57 AM MRN: [id number] Endoscopist: Mallie Mussel L. Loletha Carrow , MD Age: 71 Referring MD:  Date of Birth: 1951/04/25 Gender: Female Account #: [id number] Procedure:                Upper GI endoscopy Indications:              Esophageal dysphagia (see recent office consult                            note for details) Medicines:                Monitored Anesthesia Care Procedure:                Pre-Anesthesia Assessment:                           - Prior to the procedure, a History and Physical                            was performed, and patient medications and                            allergies were reviewed. The patient's tolerance of                            previous anesthesia was also reviewed. The risks                            and benefits of the procedure and the sedation                            options and risks were discussed with the patient.                            All questions were answered, and informed consent                            was obtained. Prior Anticoagulants: The patient has                            taken no previous anticoagulant or antiplatelet                            agents. ASA Grade Assessment: II - A patient with                            mild systemic disease. After reviewing the risks                            and benefits, the patient was deemed in                            satisfactory condition to undergo the procedure.  After obtaining informed consent, the endoscope was                            passed under direct vision. Throughout the                            procedure, the patient's blood pressure, pulse, and                            oxygen saturations were monitored continuously. The                            Endoscope was introduced through the mouth, and                            advanced to the second part of duodenum. The  upper                            GI endoscopy was accomplished without difficulty.                            The patient tolerated the procedure well. Scope In: Scope Out: Findings:                 There is no endoscopic evidence of Barrett's                            esophagus, esophagitis, hiatal hernia, mucosal                            abnormalities or stricture in the entire esophagus.                            No resistance passing scope through the UES or LES                            and no dilatation noted.                           The esophagus was therefore normal.                           Multiple sessile fundic gland polyps were found in                            the gastric fundus and in the gastric body.                           The exam of the stomach was otherwise normal.                           The cardia and gastric fundus were normal on  retroflexion.                           A few small angioectasias without bleeding were                            found in the second portion of the duodenum.                           The exam of the duodenum was otherwise normal. Complications:            No immediate complications. Estimated Blood Loss:     Estimated blood loss: none. Impression:               - Normal esophagus.                           - Multiple fundic gland polyps.                           - A few non-bleeding angioectasias in the duodenum.                           - No specimens collected.                           Since no stricture or mucosal abnormalities were                            seen, the recent episode of food impaction (which                            was the only episode in years) sounds like                            esophageal spasm. Recommendation:           - Patient has a contact number available for                            emergencies. The signs and symptoms of potential                             delayed complications were discussed with the                            patient. Return to normal activities tomorrow.                            Written discharge instructions were provided to the                            patient.                           - Resume previous diet.                           -  Continue present medications. PPI can be taken as                            needed for heartburn.                           - Contact me if there is recurrent dysphagia, and                            an esophageal manometry study will be scheduled. Ikesha Siller L. Loletha Carrow, MD 05/26/2022 8:15:44 AM This report has been signed electronically.

## 2022-05-26 NOTE — Progress Notes (Signed)
Pt's states no medical or surgical changes since previsit or office visit. 

## 2022-05-26 NOTE — Progress Notes (Signed)
A and O x3. Report to RN. Tolerated MAC anesthesia well.Teeth unchanged after procedure. 

## 2022-05-26 NOTE — Patient Instructions (Addendum)
Resume previous diet and continue present medications.  PPI can be taken as needed for heartburn. Contact Dr. Loletha Carrow if there is recurrent dysphagia, and an esophageal manometry study will be scheduled.   YOU HAD AN ENDOSCOPIC PROCEDURE TODAY AT [facility]:   Refer to the procedure report that was given to you for any specific questions about what was found during the examination.  If the procedure report does not answer your questions, please call your gastroenterologist to clarify.  If you requested that your care partner not be given the details of your procedure findings, then the procedure report has been included in a sealed envelope for you to review at your convenience later.  YOU SHOULD EXPECT: Some feelings of bloating in the abdomen. Passage of more gas than usual.  Walking can help get rid of the air that was put into your GI tract during the procedure and reduce the bloating. If you had a lower endoscopy (such as a colonoscopy or flexible sigmoidoscopy) you may notice spotting of blood in your stool or on the toilet paper. If you underwent a bowel prep for your procedure, you may not have a normal bowel movement for a few days.  Please Note:  You might notice some irritation and congestion in your nose or some drainage.  This is from the oxygen used during your procedure.  There is no need for concern and it should clear up in a day or so.  SYMPTOMS TO REPORT IMMEDIATELY:  Following upper endoscopy (EGD)  Vomiting of blood or coffee ground material  New chest pain or pain under the shoulder blades  Painful or persistently difficult swallowing  New shortness of breath  Fever of 100F or higher  Black, tarry-looking stools  For urgent or emergent issues, a gastroenterologist can be reached at any hour by calling [phone]. Do not use MyChart messaging for urgent concerns.    DIET:  We do recommend a small meal at first, but then you may proceed to your  regular diet.  Drink plenty of fluids but you should avoid alcoholic beverages for 24 hours.  ACTIVITY:  You should plan to take it easy for the rest of today and you should NOT DRIVE or use heavy machinery until tomorrow (because of the sedation medicines used during the test).    FOLLOW UP: Our staff will call the number listed on your records the next business day following your procedure.  We will call around 7:15- 8:00 am to check on you and address any questions or concerns that you may have regarding the information given to you following your procedure. If we do not reach you, we will leave a message.     If any biopsies were taken you will be contacted by phone or by letter within the next 1-3 weeks.  Please call us at [phone] if you have not heard about the biopsies in 3 weeks.    SIGNATURES/CONFIDENTIALITY: You and/or your care partner have signed paperwork which will be entered into your electronic medical record.  These signatures attest to the fact that that the information above on your After Visit Summary has been reviewed and is understood.  Full responsibility of the confidentiality of this discharge information lies with you and/or your care-partner.

## 2022-05-27 ENCOUNTER — Telehealth: Payer: Self-pay

## 2022-05-27 NOTE — Telephone Encounter (Signed)
  Follow up Call-     05/26/2022    7:19 AM  Call back number  Post procedure Call Back phone  # [phone]  Permission to leave phone message Yes     Patient questions:  Do you have a fever, pain , or abdominal swelling? No. Pain Score  0 *  Have you tolerated food without any problems? Yes.    Have you been able to return to your normal activities? Yes.    Do you have any questions about your discharge instructions: Diet   No. Medications  No. Follow up visit  No.  Do you have questions or concerns about your Care? No.  Actions: * If pain score is 4 or above: No action needed, pain <4.

## 2022-08-03 ENCOUNTER — Encounter: Payer: Self-pay | Admitting: Cardiovascular Disease

## 2022-08-21 ENCOUNTER — Other Ambulatory Visit: Payer: Self-pay | Admitting: Cardiovascular Disease

## 2022-08-21 DIAGNOSIS — I779 Disorder of arteries and arterioles, unspecified: Secondary | ICD-10-CM

## 2022-08-21 DIAGNOSIS — E7800 Pure hypercholesterolemia, unspecified: Secondary | ICD-10-CM

## 2022-08-21 DIAGNOSIS — E782 Mixed hyperlipidemia: Secondary | ICD-10-CM

## 2022-09-22 ENCOUNTER — Encounter: Payer: Self-pay | Admitting: Gastroenterology

## 2022-09-22 ENCOUNTER — Telehealth: Payer: Self-pay | Admitting: Cardiovascular Disease

## 2022-09-22 DIAGNOSIS — E782 Mixed hyperlipidemia: Secondary | ICD-10-CM

## 2022-09-22 DIAGNOSIS — I779 Disorder of arteries and arterioles, unspecified: Secondary | ICD-10-CM

## 2022-09-22 DIAGNOSIS — I2510 Atherosclerotic heart disease of native coronary artery without angina pectoris: Secondary | ICD-10-CM

## 2022-09-22 DIAGNOSIS — E7800 Pure hypercholesterolemia, unspecified: Secondary | ICD-10-CM

## 2022-09-22 MED ORDER — PRALUENT 150 MG/ML ~~LOC~~ SOAJ: 150.0000 mg | SUBCUTANEOUS | 0 refills | Status: DC

## 2022-09-22 NOTE — Telephone Encounter (Signed)
Re enrolled patient in grant again.  CARD NO. [id number]   CARD STATUS Active   BIN [id number]   PCN [id number]   PC GROUP [id number]

## 2022-09-22 NOTE — Telephone Encounter (Signed)
Patient stated she is no longer under the grant for Praluent. She cannot afford $100.00 for 2 injections. She is due to take her next injection this week. She wants to know if there is any assistance for the drug.

## 2022-09-22 NOTE — Telephone Encounter (Signed)
Patient's grant has been updated. New Rx with grant information submitted to pharmacy.

## 2022-09-22 NOTE — Telephone Encounter (Signed)
Pt c/o medication issue:  1. Name of Medication: Alirocumab (PRALUENT) 150 MG/ML SOAJ   2. How are you currently taking this medication (dosage and times per day)? As prescribed  3. Are you having a reaction (difficulty breathing--STAT)? No   4. What is your medication issue? Patient is wanting to renew assistance for cost of medication. She tried picking up the medication and they said it would be $100 for two injections.

## 2022-09-22 NOTE — Telephone Encounter (Signed)
Patient informed her grant has been updated. She thanked [place] for helping her.

## 2022-10-01 ENCOUNTER — Other Ambulatory Visit (HOSPITAL_COMMUNITY): Payer: Self-pay

## 2022-10-02 ENCOUNTER — Other Ambulatory Visit (HOSPITAL_COMMUNITY): Payer: Self-pay

## 2022-10-05 ENCOUNTER — Telehealth: Payer: Self-pay

## 2022-10-05 DIAGNOSIS — E7800 Pure hypercholesterolemia, unspecified: Secondary | ICD-10-CM

## 2022-10-05 DIAGNOSIS — I779 Disorder of arteries and arterioles, unspecified: Secondary | ICD-10-CM

## 2022-10-05 NOTE — Telephone Encounter (Signed)
Pharmacy Patient Advocate Encounter  Prior Authorization for Repatha '140MG'$ /ML has been approved.    KEY# [id number] Effective dates: 1.1.24 through 12.31.24

## 2022-10-08 ENCOUNTER — Telehealth: Payer: Self-pay | Admitting: Cardiovascular Disease

## 2022-10-08 MED ORDER — REPATHA SURECLICK 140 MG/ML ~~LOC~~ SOAJ: 1.0000 mL | SUBCUTANEOUS | 3 refills | Status: DC

## 2022-10-08 NOTE — Telephone Encounter (Signed)
Rx sent to pharmacy. Patient already enrolled in grant program.  Needs pharmacy to bill copay with same grant information that they billed Praluent with.  CARD NO. [id number]   CARD STATUS Active   BIN [id number]   PCN [id number]   PC GROUP [id number]

## 2022-10-08 NOTE — Telephone Encounter (Signed)
Patient advised of response by PharmD regarding repatha and grant.

## 2022-10-08 NOTE — Telephone Encounter (Signed)
Patient stated her [facility] [address] never received the prescription for repatha. She also wants to know if there is a grant for the medication.

## 2022-10-08 NOTE — Telephone Encounter (Signed)
Pt c/o medication issue:  1. Name of Medication: repatha  2. How are you currently taking this medication (dosage and times per day)? Not currently taking  3. Are you having a reaction (difficulty breathing--STAT)? no  4. What is your medication issue? Patient states her praluent was denied by her insurance and they are switching to repatha. She says her pharmacy has not received a prescription for the repatha yet. She would like a call back when it is sent and the prior Josem Kaufmann is approved.

## 2022-11-18 ENCOUNTER — Ambulatory Visit: Payer: Medicare HMO | Admitting: Cardiology

## 2022-11-18 DIAGNOSIS — H02831 Dermatochalasis of right upper eyelid: Secondary | ICD-10-CM | POA: Diagnosis not present

## 2022-11-18 DIAGNOSIS — H02834 Dermatochalasis of left upper eyelid: Secondary | ICD-10-CM | POA: Diagnosis not present

## 2022-11-18 DIAGNOSIS — H5202 Hypermetropia, left eye: Secondary | ICD-10-CM | POA: Diagnosis not present

## 2022-11-18 DIAGNOSIS — H5211 Myopia, right eye: Secondary | ICD-10-CM | POA: Diagnosis not present

## 2022-11-18 DIAGNOSIS — H25813 Combined forms of age-related cataract, bilateral: Secondary | ICD-10-CM | POA: Diagnosis not present

## 2022-11-23 NOTE — Progress Notes (Signed)
  Electrophysiology Office Follow up Visit Note:    Date:  11/24/2022   ID:  Sylvia Gonzalez, DOB 1951-02-27, MRN [id number]  PCP:  Melida Quitter, MD  [facility] Cardiologist:  None  [facility] Electrophysiologist:  Lanier Prude, MD    Interval History:    Sylvia Gonzalez is a 72 y.o. female who presents for a follow up visit.   Hx of PPM and AF. I last saw her 09/23/2021. PPM Implanted in [place] in 2002 with gen change 2022.  Her device was transitioned for remote monitoring at our lats appointment.  It looks like she never transitioned her remotes to our clinic.  No problems since I last saw her.     Past medical, surgical, social and family history were reviewed.  ROS:   Please see the history of present illness.    All other systems reviewed and are negative.  EKGs/Labs/Other Studies Reviewed:    The following studies were reviewed today:  11/24/2022 in clinic device interrogation personally reviewed Battery longevity 6 to 9.7 years Programmed DDD 60-1 10 Atrial pacing 8.9% Ventricular pacing 11% 7 high ventricular rate episodes appear to be atrial high rates with one-to-one conduction.  Longest episode 3 seconds  Physical Exam:    VS:  BP 120/72   Pulse 77   Ht 5\' 7"  (1.702 m)   Wt 142 lb (64.4 kg)   SpO2 99%   BMI 22.24 kg/m     Wt Readings from Last 3 Encounters:  11/24/22 142 lb (64.4 kg)  05/26/22 139 lb (63 kg)  05/21/22 139 lb (63 kg)     GEN:  Well nourished, well developed in no acute distress CARDIAC: RRR, no murmurs, rubs, gallops.  Pacemaker pocket well-healed RESPIRATORY:  Clear to auscultation without rales, wheezing or rhonchi       ASSESSMENT:    1. Complete heart block (HCC)   2. Presence of cardiac pacemaker    PLAN:    In order of problems listed above:  #CHB #PPM in situ PPM functioning appropriately. Establish for remote monitoring.  Follow-up 1 year with APP.     Signed, Steffanie Dunn, MD, [facility],  [facility] 11/24/2022 1:38 PM    Electrophysiology [facility]

## 2022-11-24 ENCOUNTER — Encounter: Payer: Self-pay | Admitting: Cardiology

## 2022-11-24 ENCOUNTER — Ambulatory Visit: Payer: Medicare HMO | Attending: Cardiology | Admitting: Cardiology

## 2022-11-24 VITALS — BP 120/72 | HR 77 | Ht 67.0 in | Wt 142.0 lb

## 2022-11-24 DIAGNOSIS — Z950 Presence of cardiac pacemaker: Secondary | ICD-10-CM

## 2022-11-24 DIAGNOSIS — I442 Atrioventricular block, complete: Secondary | ICD-10-CM

## 2022-11-24 NOTE — Patient Instructions (Signed)
Medication Instructions: Your physician recommends that you continue on your current medications as directed. Please refer to the Current Medication list given to you today. *If you need a refill on your cardiac medications before your next appointment, please call your pharmacy*  Follow-Up: At [facility], you and your health needs are our priority.  As part of our continuing mission to provide you with exceptional heart care, we have created designated Provider Care Teams.  These Care Teams include your primary Cardiologist (physician) and Advanced Practice Providers (APPs -  Physician Assistants and Nurse Practitioners) who all work together to provide you with the care you need, when you need it.   Your next appointment:   1 year(s)  Provider:   Lars Mage, MD

## 2022-11-25 DIAGNOSIS — Z1231 Encounter for screening mammogram for malignant neoplasm of breast: Secondary | ICD-10-CM | POA: Diagnosis not present

## 2023-02-23 IMAGING — CT CT CARDIAC CORONARY ARTERY CALCIUM SCORE
3 series · 14 of 20 positions shown, 16 images · non-contrast
Comparison: None.
COMPARISON: None.

Addendum:
EXAM:
OVER-READ INTERPRETATION  CT CHEST

The following report is an over-read performed by radiologist Dr.
Kelvina Villi [REDACTED] on 09/03/2021. This
over-read does not include interpretation of cardiac or coronary
anatomy or pathology. The coronary calcium score interpretation by
the cardiologist is attached.
CLINICAL DATA: Cardiovascular Disease Risk stratification
Coronary Calcium Score
TECHNIQUE: A gated, non-contrast computed tomography scan of the heart was
performed using 3mm slice thickness. Axial images were analyzed on a
dedicated workstation. Calcium scoring of the coronary arteries was
performed using the Agatston method.

[Series 2: cascseq 2.0 sa36 70% (id) · axial · 0.39mm/px · z∈[-224,-134]mm · 4 of 76 slices shown]
[im 16/76  vessel]
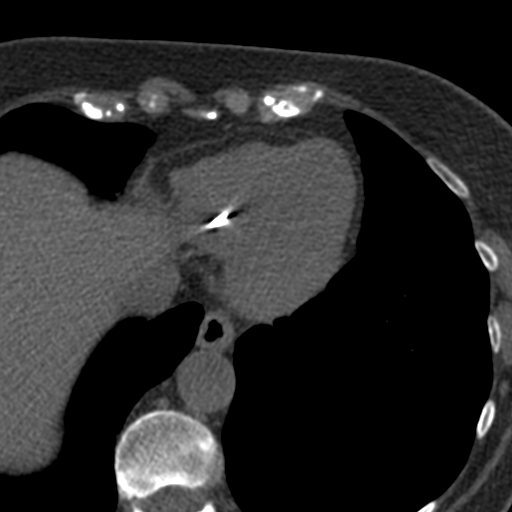
[im 31/76  vessel]
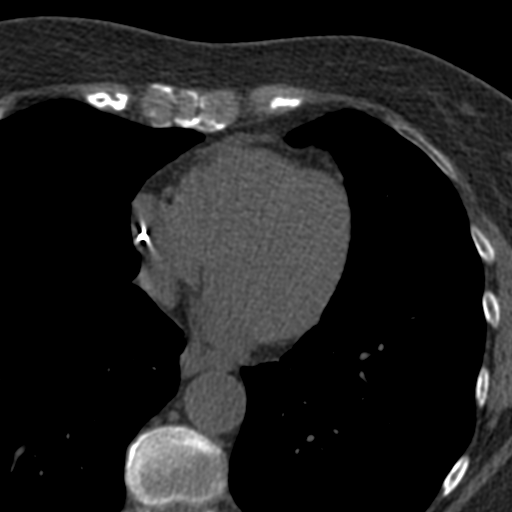
[im 46/76  vessel]
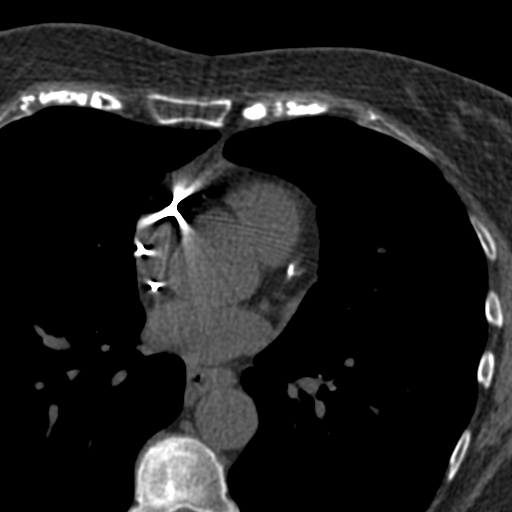
[im 61/76  vessel]
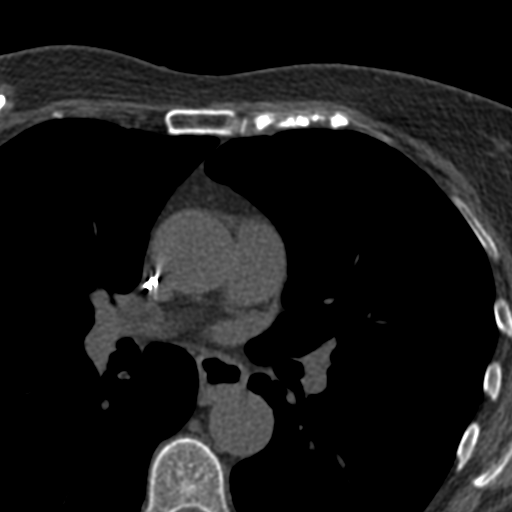

[Series 3: cascseq 2.0 bf37 st · axial · 0.61mm/px · z∈[-230,-130]mm · 5 of 76 slices shown, 7 images]
[im 13/76  vessel]
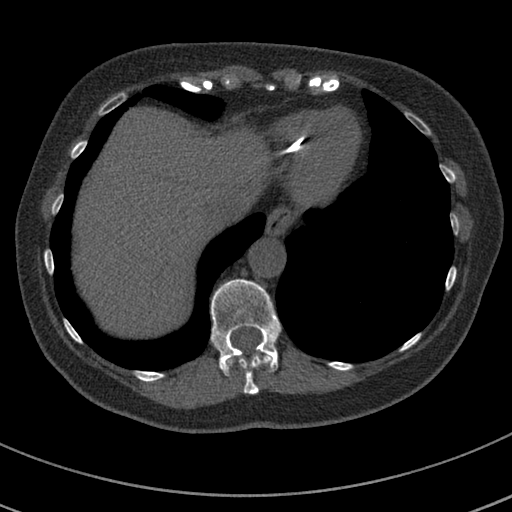
[im 13/76  lung]
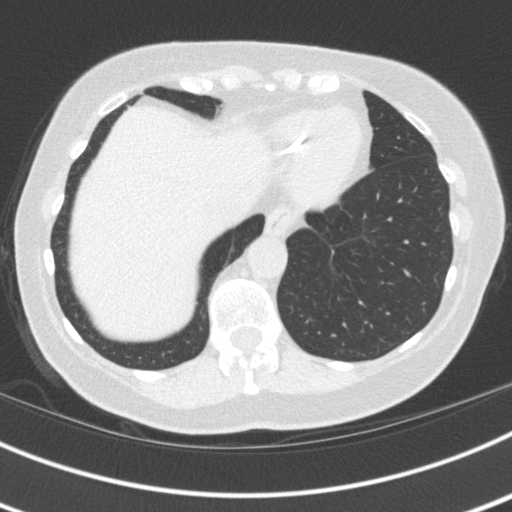
[im 26/76  vessel]
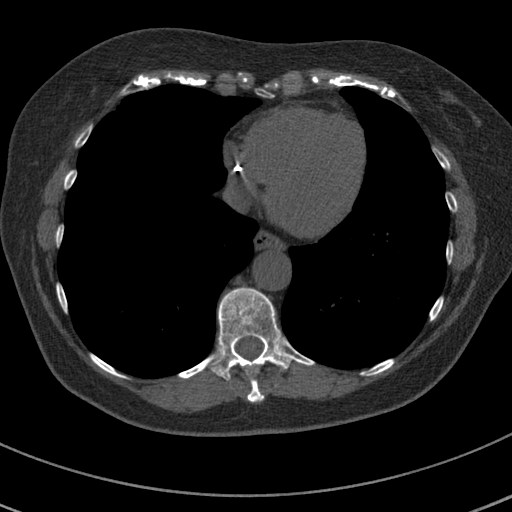
[im 38/76  vessel]
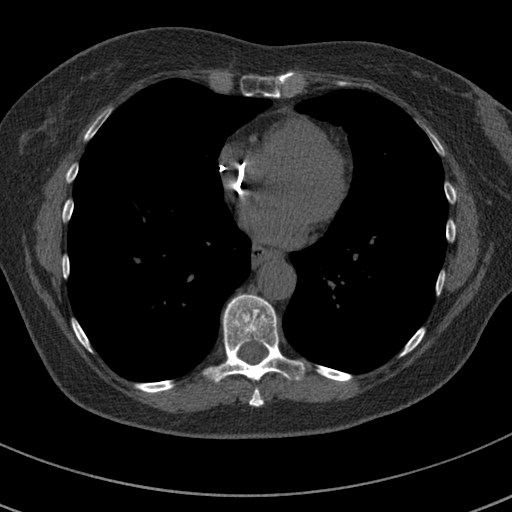
[im 51/76  vessel]
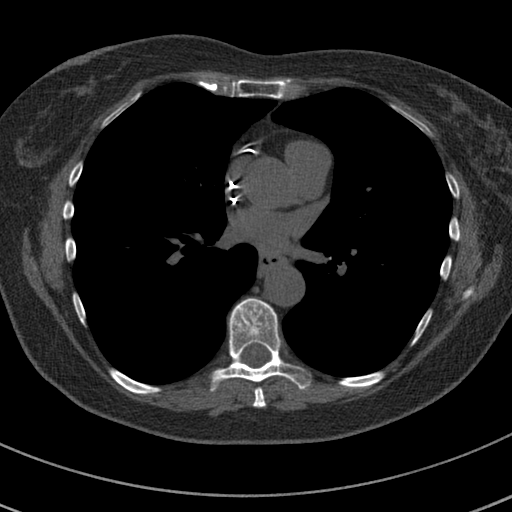
[im 63/76  vessel]
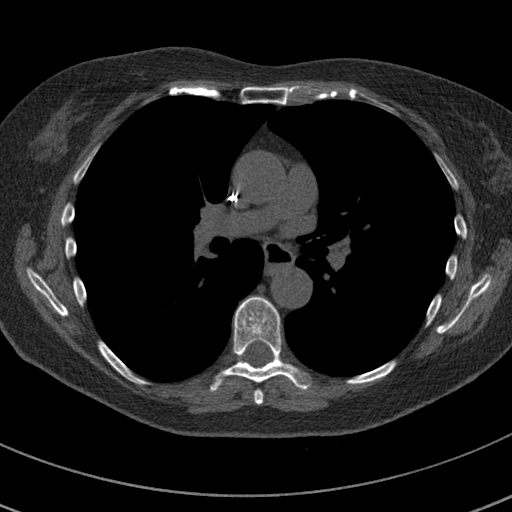
[im 63/76  lung]
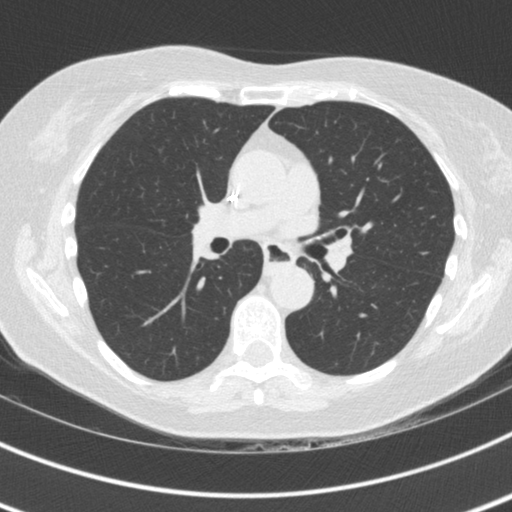

[Series 4: cascseq 2.0 br59 lung · axial · 0.61mm/px · z∈[-230,-130]mm · 5 of 76 slices shown]
[im 13/76  lung]
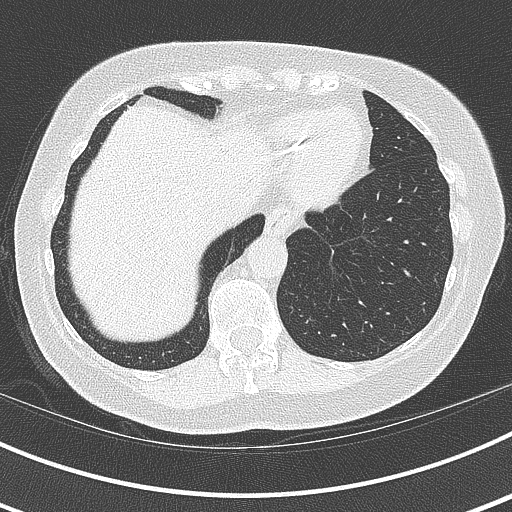
[im 26/76  lung]
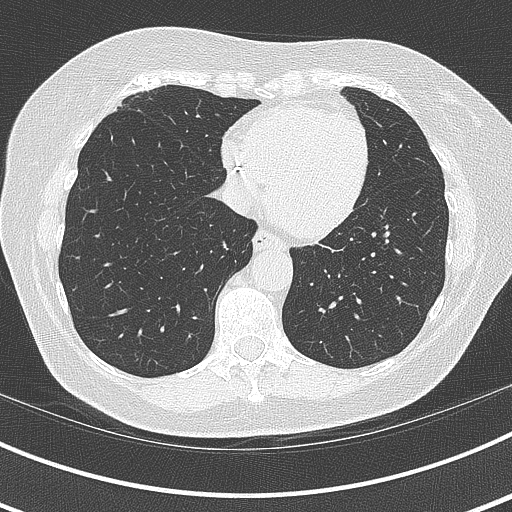
[im 38/76  lung]
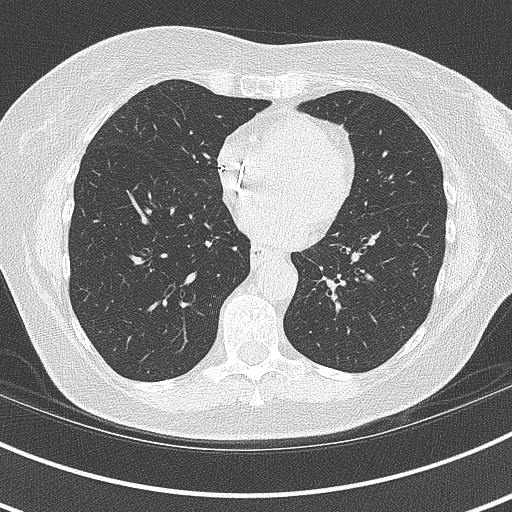
[im 51/76  lung]
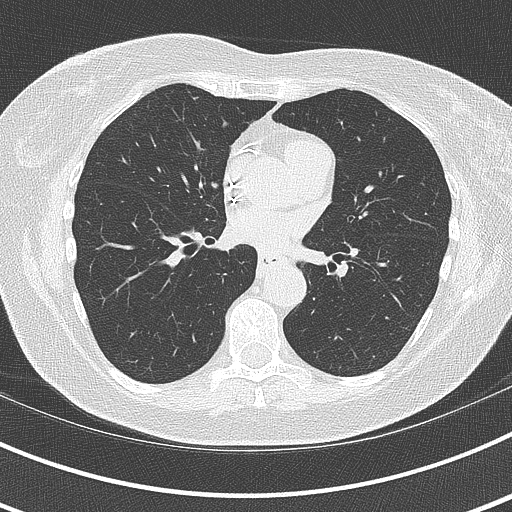
[im 63/76  lung]
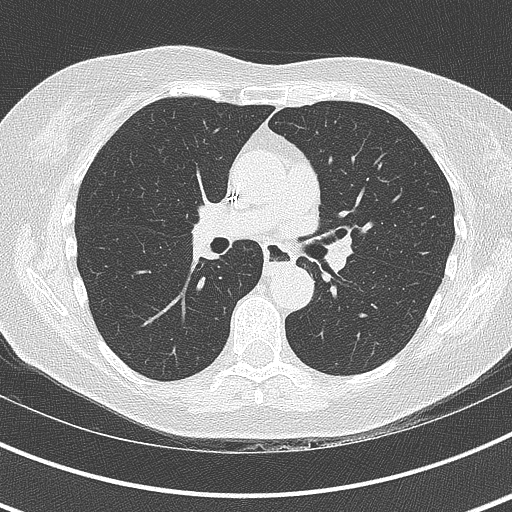

[14 of 20 positions shown; findings below may reference images not displayed]

FINDINGS: Vascular: Pacer wires in the right heart. Normal heart size. Aorta
normal caliber.

Mediastinum/Nodes: No adenopathy

Lungs/Pleura: No confluent opacity or effusion.

Upper Abdomen: Imaging into the upper abdomen demonstrates no acute
findings.

Musculoskeletal: Chest wall soft tissues are unremarkable. No acute
bony abnormality.
IMPRESSION: No acute extra cardiac abnormality.
FINDINGS: Coronary arteries: Normal origins.

Coronary Calcium Score:

Left main: 0

Left anterior descending artery: 59

Left circumflex artery: 41

Right coronary artery: 1

Total: 101

Percentile: 70

Pericardium: Normal.

Ascending Aorta: Normal caliber.

Pacemaker leads (artifact present however LAD and Circumflex score
is accurate)

Non-cardiac: See separate report from [REDACTED].
IMPRESSION: Coronary calcium score of 101. This was 71 percentile for age-,
race-, and sex-matched controls.



If CAC=0, it is reasonable to withhold statin therapy and reassess
in 5 to 10 years, as long as higher risk conditions are absent
(diabetes mellitus, family history of premature CHD in first degree
relatives (males <55 years; females <65 years), cigarette smoking,
or LDL >=190 mg/dL).

If CAC is 1 to 99, it is reasonable to initiate statin therapy for
patients >=55 years of age.

If CAC is >=100 or >=75th percentile, it is reasonable to initiate
statin therapy at any age.

Cardiology referral should be considered for patients with CAC
scores >=400 or >=75th percentile.

*1239 AHA/ACC/AACVPR/AAPA/ABC/AKIJI/TALEI V/SKILLZ/Quirijn/MEETA/ANA X/AUJLA
Guideline on the Management of Blood Cholesterol: A Report of the
American College of Cardiology/American Heart Association Task Force
on Clinical Practice Guidelines. J Am Coll Cardiol.
0433;73(24):0288-0410.

*** End of Addendum ***
EXAM:
OVER-READ INTERPRETATION  CT CHEST

The following report is an over-read performed by radiologist Dr.
Kelvina Villi [REDACTED] on 09/03/2021. This
over-read does not include interpretation of cardiac or coronary
anatomy or pathology. The coronary calcium score interpretation by
the cardiologist is attached.
FINDINGS: Vascular: Pacer wires in the right heart. Normal heart size. Aorta
normal caliber.

Mediastinum/Nodes: No adenopathy

Lungs/Pleura: No confluent opacity or effusion.

Upper Abdomen: Imaging into the upper abdomen demonstrates no acute
findings.

Musculoskeletal: Chest wall soft tissues are unremarkable. No acute
bony abnormality.
IMPRESSION: No acute extra cardiac abnormality.

## 2023-03-03 DIAGNOSIS — M8580 Other specified disorders of bone density and structure, unspecified site: Secondary | ICD-10-CM | POA: Diagnosis not present

## 2023-03-03 DIAGNOSIS — R7303 Prediabetes: Secondary | ICD-10-CM | POA: Diagnosis not present

## 2023-03-03 DIAGNOSIS — E785 Hyperlipidemia, unspecified: Secondary | ICD-10-CM | POA: Diagnosis not present

## 2023-03-03 DIAGNOSIS — R7989 Other specified abnormal findings of blood chemistry: Secondary | ICD-10-CM | POA: Diagnosis not present

## 2023-03-10 DIAGNOSIS — K219 Gastro-esophageal reflux disease without esophagitis: Secondary | ICD-10-CM | POA: Diagnosis not present

## 2023-03-10 DIAGNOSIS — Z1331 Encounter for screening for depression: Secondary | ICD-10-CM | POA: Diagnosis not present

## 2023-03-10 DIAGNOSIS — Z853 Personal history of malignant neoplasm of breast: Secondary | ICD-10-CM | POA: Diagnosis not present

## 2023-03-10 DIAGNOSIS — G609 Hereditary and idiopathic neuropathy, unspecified: Secondary | ICD-10-CM | POA: Diagnosis not present

## 2023-03-10 DIAGNOSIS — M8580 Other specified disorders of bone density and structure, unspecified site: Secondary | ICD-10-CM | POA: Diagnosis not present

## 2023-03-10 DIAGNOSIS — Z1339 Encounter for screening examination for other mental health and behavioral disorders: Secondary | ICD-10-CM | POA: Diagnosis not present

## 2023-03-10 DIAGNOSIS — Z0000 Encounter for general adult medical examination without abnormal findings: Secondary | ICD-10-CM | POA: Diagnosis not present

## 2023-03-10 DIAGNOSIS — E785 Hyperlipidemia, unspecified: Secondary | ICD-10-CM | POA: Diagnosis not present

## 2023-03-10 DIAGNOSIS — Z23 Encounter for immunization: Secondary | ICD-10-CM | POA: Diagnosis not present

## 2023-03-10 DIAGNOSIS — M3500 Sicca syndrome, unspecified: Secondary | ICD-10-CM | POA: Diagnosis not present

## 2023-03-10 DIAGNOSIS — I480 Paroxysmal atrial fibrillation: Secondary | ICD-10-CM | POA: Diagnosis not present

## 2023-03-10 DIAGNOSIS — G729 Myopathy, unspecified: Secondary | ICD-10-CM | POA: Diagnosis not present

## 2023-09-24 ENCOUNTER — Other Ambulatory Visit: Payer: Self-pay | Admitting: Cardiovascular Disease

## 2023-09-24 DIAGNOSIS — I779 Disorder of arteries and arterioles, unspecified: Secondary | ICD-10-CM

## 2023-09-24 DIAGNOSIS — E7800 Pure hypercholesterolemia, unspecified: Secondary | ICD-10-CM

## 2023-09-28 DIAGNOSIS — L258 Unspecified contact dermatitis due to other agents: Secondary | ICD-10-CM | POA: Diagnosis not present

## 2023-09-29 ENCOUNTER — Telehealth: Payer: Self-pay | Admitting: Pharmacy Technician

## 2023-09-29 NOTE — Telephone Encounter (Signed)
 patient needs to renew her prior authorization for Repatha  for this year

## 2023-09-30 ENCOUNTER — Other Ambulatory Visit (HOSPITAL_COMMUNITY): Payer: Self-pay

## 2023-09-30 NOTE — Telephone Encounter (Signed)
Pharmacy Patient Advocate Encounter   Received notification from Pt Calls Messages that prior authorization for repatha is required/requested.   Insurance verification completed.   The patient is insured through [employer] .   Per test claim: Refill too soon. PA is not needed at this time. Medication was filled 09/27/23. Next eligible fill date is 11/29/23.   PA renewed until 09/13/24

## 2023-10-06 ENCOUNTER — Telehealth: Payer: Self-pay | Admitting: Cardiology

## 2023-10-06 NOTE — Telephone Encounter (Signed)
Pt c/o medication issue:  1. Name of Medication:   Evolocumab (REPATHA SURECLICK) 140 MG/ML SOAJ   2. How are you currently taking this medication (dosage and times per day)?   As prescribed  3. Are you having a reaction (difficulty breathing--STAT)?   4. What is your medication issue?   Patient stated she is completely out of this medication and is due to take another shot on Friday, 1/24.  Patient stated she wants to re-apply for the grant to get this medication at reduced cost.

## 2023-10-07 NOTE — Telephone Encounter (Signed)
[employer] grant renewed. Patient given info over the phone. She was appreciative of the help.     CARD NO.?[id number] CARD STATUS?Active [id number] PCN?[id number] OZHYQ?[id number]

## 2023-12-03 DIAGNOSIS — Z1231 Encounter for screening mammogram for malignant neoplasm of breast: Secondary | ICD-10-CM | POA: Diagnosis not present

## 2023-12-13 DIAGNOSIS — R92321 Mammographic fibroglandular density, right breast: Secondary | ICD-10-CM | POA: Diagnosis not present

## 2023-12-13 DIAGNOSIS — R922 Inconclusive mammogram: Secondary | ICD-10-CM | POA: Diagnosis not present

## 2023-12-16 DIAGNOSIS — R051 Acute cough: Secondary | ICD-10-CM | POA: Diagnosis not present

## 2023-12-16 DIAGNOSIS — J302 Other seasonal allergic rhinitis: Secondary | ICD-10-CM | POA: Diagnosis not present

## 2023-12-16 NOTE — Progress Notes (Unsigned)
  Electrophysiology Office Follow up Visit Note:    Date:  12/17/2023   ID:  Sylvia Gonzalez, DOB 04-Jun-1951, MRN [id number]  PCP:  Melida Quitter, MD  [facility] Cardiologist:  None  [facility] Electrophysiologist:  Lanier Prude, MD    Interval History:     Sylvia Gonzalez is a 73 y.o. female who presents for a follow up visit.   I last saw the patient November 24, 2022.  She has a pacemaker was implanted in [facility] in 2020 with a generator replacement 2022.  She is doing well today.  No complaints.  No problems with her pacemaker.  She is interested in establishing for remote monitoring.       Past medical, surgical, social and family history were reviewed.  ROS:   Please see the history of present illness.    All other systems reviewed and are negative.  EKGs/Labs/Other Studies Reviewed:    The following studies were reviewed today:  December 17, 2023 in clinic device interrogation personally reviewed Battery and lead parameter stable.  No programming changes today.  Will establish for remote monitoring.        Physical Exam:    VS:  BP 102/60   Pulse 77   Ht 5\' 7"  (1.702 m)   Wt 141 lb (64 kg)   SpO2 97%   BMI 22.08 kg/m     Wt Readings from Last 3 Encounters:  12/17/23 141 lb (64 kg)  11/24/22 142 lb (64.4 kg)  05/26/22 139 lb (63 kg)     GEN: no distress CARD: RRR, No MRG.  CIED pocket well-healed RESP: No IWOB. CTAB.      ASSESSMENT:    1. Complete heart block (HCC)   2. Presence of cardiac pacemaker    PLAN:    In order of problems listed above:  #Complete heart block #Permanent pacemaker in situ Pacemaker functioning appropriately   Follow-up 1 year with APP  Signed, Steffanie Dunn, MD, [facility], [facility] 12/17/2023 1:43 PM    Electrophysiology [facility]

## 2023-12-17 ENCOUNTER — Ambulatory Visit: Payer: Medicare HMO | Attending: Internal Medicine | Admitting: Cardiology

## 2023-12-17 ENCOUNTER — Encounter: Payer: Self-pay | Admitting: Cardiology

## 2023-12-17 VITALS — BP 102/60 | HR 77 | Ht 67.0 in | Wt 141.0 lb

## 2023-12-17 DIAGNOSIS — Z950 Presence of cardiac pacemaker: Secondary | ICD-10-CM | POA: Diagnosis not present

## 2023-12-17 DIAGNOSIS — I442 Atrioventricular block, complete: Secondary | ICD-10-CM | POA: Diagnosis not present

## 2023-12-17 LAB — CUP PACEART INCLINIC DEVICE CHECK
Battery Remaining Longevity: 104 mo
Battery Voltage: 3.01 V
Brady Statistic RA Percent Paced: 12 %
Brady Statistic RV Percent Paced: 17 %
Date Time Interrogation Session: 20250404160651
Implantable Pulse Generator Implant Date: 20221017
Lead Channel Impedance Value: 425 Ohm
Lead Channel Impedance Value: 812.5 Ohm
Lead Channel Pacing Threshold Amplitude: 0.75 V
Lead Channel Pacing Threshold Amplitude: 0.75 V
Lead Channel Pacing Threshold Amplitude: 2.5 V
Lead Channel Pacing Threshold Amplitude: 2.5 V
Lead Channel Pacing Threshold Pulse Width: 0.5 ms
Lead Channel Pacing Threshold Pulse Width: 0.5 ms
Lead Channel Pacing Threshold Pulse Width: 1 ms
Lead Channel Pacing Threshold Pulse Width: 1 ms
Lead Channel Sensing Intrinsic Amplitude: 2.2 mV
Lead Channel Sensing Intrinsic Amplitude: 2.5 mV
Lead Channel Setting Pacing Amplitude: 1.75 V
Lead Channel Setting Pacing Amplitude: 2.875
Lead Channel Setting Pacing Pulse Width: 1 ms
Lead Channel Setting Sensing Sensitivity: 1 mV
Pulse Gen Model: 2272
Pulse Gen Serial Number: 6462631

## 2023-12-17 NOTE — Patient Instructions (Signed)
 Medication Instructions:  Your physician recommends that you continue on your current medications as directed. Please refer to the Current Medication list given to you today.  *If you need a refill on your cardiac medications before your next appointment, please call your pharmacy*  Follow-Up: At [facility], you and your health needs are our priority.  As part of our continuing mission to provide you with exceptional heart care, our providers are all part of one team.  This team includes your primary Cardiologist (physician) and Advanced Practice Providers or APPs (Physician Assistants and Nurse Practitioners) who all work together to provide you with the care you need, when you need it.  Your next appointment:   1 year  Provider:   You will see one of the following Advanced Practice Providers on your designated Care Team:   Francis Dowse, [place] Casimiro Needle "Mardelle Matte" Wagoner, PA-C Sherie Don, NP Canary Brim, NP       1st Floor: - Lobby - Registration  - Pharmacy  - Lab - Cafe  2nd Floor: - PV Lab - Diagnostic Testing (echo, CT, nuclear med)  3rd Floor: - Vacant  4th Floor: - TCTS (cardiothoracic surgery) - AFib Clinic - Structural Heart Clinic - Vascular Surgery  - Vascular Ultrasound  5th Floor: - HeartCare Cardiology (general and EP) - Clinical Pharmacy for coumadin, hypertension, lipid, weight-loss medications, and med management appointments    Valet parking services will be available as well.

## 2023-12-18 ENCOUNTER — Encounter: Payer: Self-pay | Admitting: Cardiology

## 2024-01-18 DIAGNOSIS — M8589 Other specified disorders of bone density and structure, multiple sites: Secondary | ICD-10-CM | POA: Diagnosis not present

## 2024-03-07 DIAGNOSIS — E7849 Other hyperlipidemia: Secondary | ICD-10-CM | POA: Diagnosis not present

## 2024-03-14 DIAGNOSIS — Z0000 Encounter for general adult medical examination without abnormal findings: Secondary | ICD-10-CM | POA: Diagnosis not present

## 2024-03-14 DIAGNOSIS — Z950 Presence of cardiac pacemaker: Secondary | ICD-10-CM | POA: Diagnosis not present

## 2024-03-14 DIAGNOSIS — M8580 Other specified disorders of bone density and structure, unspecified site: Secondary | ICD-10-CM | POA: Diagnosis not present

## 2024-03-14 DIAGNOSIS — G729 Myopathy, unspecified: Secondary | ICD-10-CM | POA: Diagnosis not present

## 2024-03-14 DIAGNOSIS — E785 Hyperlipidemia, unspecified: Secondary | ICD-10-CM | POA: Diagnosis not present

## 2024-03-14 DIAGNOSIS — I442 Atrioventricular block, complete: Secondary | ICD-10-CM | POA: Diagnosis not present

## 2024-03-14 DIAGNOSIS — Z1339 Encounter for screening examination for other mental health and behavioral disorders: Secondary | ICD-10-CM | POA: Diagnosis not present

## 2024-03-14 DIAGNOSIS — I480 Paroxysmal atrial fibrillation: Secondary | ICD-10-CM | POA: Diagnosis not present

## 2024-03-14 DIAGNOSIS — G609 Hereditary and idiopathic neuropathy, unspecified: Secondary | ICD-10-CM | POA: Diagnosis not present

## 2024-03-14 DIAGNOSIS — R7303 Prediabetes: Secondary | ICD-10-CM | POA: Diagnosis not present

## 2024-03-14 DIAGNOSIS — Z1331 Encounter for screening for depression: Secondary | ICD-10-CM | POA: Diagnosis not present

## 2024-03-14 DIAGNOSIS — Z853 Personal history of malignant neoplasm of breast: Secondary | ICD-10-CM | POA: Diagnosis not present

## 2024-03-20 ENCOUNTER — Encounter

## 2024-03-24 ENCOUNTER — Other Ambulatory Visit: Payer: Self-pay | Admitting: Cardiovascular Disease

## 2024-03-24 DIAGNOSIS — E7800 Pure hypercholesterolemia, unspecified: Secondary | ICD-10-CM

## 2024-03-24 DIAGNOSIS — I779 Disorder of arteries and arterioles, unspecified: Secondary | ICD-10-CM

## 2024-06-19 ENCOUNTER — Encounter

## 2024-06-19 DIAGNOSIS — B9689 Other specified bacterial agents as the cause of diseases classified elsewhere: Secondary | ICD-10-CM | POA: Diagnosis not present

## 2024-06-19 DIAGNOSIS — N760 Acute vaginitis: Secondary | ICD-10-CM | POA: Diagnosis not present

## 2024-06-19 DIAGNOSIS — N898 Other specified noninflammatory disorders of vagina: Secondary | ICD-10-CM | POA: Diagnosis not present

## 2024-06-19 DIAGNOSIS — Z853 Personal history of malignant neoplasm of breast: Secondary | ICD-10-CM | POA: Diagnosis not present

## 2024-06-19 DIAGNOSIS — Z113 Encounter for screening for infections with a predominantly sexual mode of transmission: Secondary | ICD-10-CM | POA: Diagnosis not present

## 2024-06-19 DIAGNOSIS — L814 Other melanin hyperpigmentation: Secondary | ICD-10-CM | POA: Diagnosis not present

## 2024-06-19 DIAGNOSIS — K219 Gastro-esophageal reflux disease without esophagitis: Secondary | ICD-10-CM | POA: Diagnosis not present

## 2024-07-05 ENCOUNTER — Ambulatory Visit

## 2024-07-05 DIAGNOSIS — I442 Atrioventricular block, complete: Secondary | ICD-10-CM

## 2024-07-06 LAB — CUP PACEART REMOTE DEVICE CHECK
Battery Remaining Longevity: 71 mo
Battery Remaining Percentage: 73 %
Battery Voltage: 3.01 V
Brady Statistic AP VP Percent: 15 %
Brady Statistic AP VS Percent: 6.5 %
Brady Statistic AS VP Percent: 6.7 %
Brady Statistic AS VS Percent: 71 %
Brady Statistic RA Percent Paced: 22 %
Brady Statistic RV Percent Paced: 22 %
Date Time Interrogation Session: 20251022082257
Implantable Pulse Generator Implant Date: 20221017
Lead Channel Impedance Value: 400 Ohm
Lead Channel Impedance Value: 690 Ohm
Lead Channel Pacing Threshold Amplitude: 0.875 V
Lead Channel Pacing Threshold Amplitude: 2.5 V
Lead Channel Pacing Threshold Pulse Width: 0.5 ms
Lead Channel Pacing Threshold Pulse Width: 1 ms
Lead Channel Sensing Intrinsic Amplitude: 1.6 mV
Lead Channel Sensing Intrinsic Amplitude: 1.9 mV
Lead Channel Setting Pacing Amplitude: 1.875
Lead Channel Setting Pacing Amplitude: 2.75 V
Lead Channel Setting Pacing Pulse Width: 1 ms
Lead Channel Setting Sensing Sensitivity: 1 mV
Pulse Gen Model: 2272
Pulse Gen Serial Number: 6462631

## 2024-07-07 NOTE — Progress Notes (Signed)
 Remote PPM Transmission

## 2024-07-10 ENCOUNTER — Ambulatory Visit: Payer: Self-pay | Admitting: Cardiology

## 2024-07-24 DIAGNOSIS — J069 Acute upper respiratory infection, unspecified: Secondary | ICD-10-CM | POA: Diagnosis not present

## 2024-07-24 DIAGNOSIS — R5383 Other fatigue: Secondary | ICD-10-CM | POA: Diagnosis not present

## 2024-07-24 DIAGNOSIS — Z1152 Encounter for screening for COVID-19: Secondary | ICD-10-CM | POA: Diagnosis not present

## 2024-07-24 DIAGNOSIS — R058 Other specified cough: Secondary | ICD-10-CM | POA: Diagnosis not present

## 2024-07-24 DIAGNOSIS — R0981 Nasal congestion: Secondary | ICD-10-CM | POA: Diagnosis not present

## 2024-09-18 ENCOUNTER — Encounter

## 2024-10-02 NOTE — Progress Notes (Signed)
 Sylvia Gonzalez                                          MRN: [id number]   10/02/2024   The [facility] Quality Team Specialist reviewed this patient medical record for the purposes of chart review for care gap closure. The following were reviewed: chart review for care gap closure-colorectal cancer screening.    [facility] Quality Team

## 2024-10-04 ENCOUNTER — Ambulatory Visit

## 2024-10-04 DIAGNOSIS — I442 Atrioventricular block, complete: Secondary | ICD-10-CM

## 2024-10-06 LAB — CUP PACEART REMOTE DEVICE CHECK
Battery Remaining Longevity: 70 mo
Battery Remaining Percentage: 70 %
Battery Voltage: 3.02 V
Brady Statistic AP VP Percent: 13 %
Brady Statistic AP VS Percent: 5.8 %
Brady Statistic AS VP Percent: 6.5 %
Brady Statistic AS VS Percent: 75 %
Brady Statistic RA Percent Paced: 18 %
Brady Statistic RV Percent Paced: 19 %
Date Time Interrogation Session: 20260122125905
Implantable Pulse Generator Implant Date: 20221017
Lead Channel Impedance Value: 430 Ohm
Lead Channel Impedance Value: 700 Ohm
Lead Channel Pacing Threshold Amplitude: 0.75 V
Lead Channel Pacing Threshold Amplitude: 2 V
Lead Channel Pacing Threshold Pulse Width: 0.5 ms
Lead Channel Pacing Threshold Pulse Width: 1 ms
Lead Channel Sensing Intrinsic Amplitude: 1.7 mV
Lead Channel Sensing Intrinsic Amplitude: 2.2 mV
Lead Channel Setting Pacing Amplitude: 1.75 V
Lead Channel Setting Pacing Amplitude: 2.25 V
Lead Channel Setting Pacing Pulse Width: 1 ms
Lead Channel Setting Sensing Sensitivity: 1 mV
Pulse Gen Model: 2272
Pulse Gen Serial Number: 6462631

## 2024-10-07 NOTE — Progress Notes (Signed)
 Remote PPM Transmission

## 2024-10-10 ENCOUNTER — Ambulatory Visit: Payer: Self-pay | Admitting: Cardiovascular Disease

## 2024-12-18 ENCOUNTER — Encounter

## 2025-01-03 ENCOUNTER — Ambulatory Visit

## 2025-03-19 ENCOUNTER — Encounter

## 2025-04-04 ENCOUNTER — Ambulatory Visit

## 2025-06-18 ENCOUNTER — Encounter

## 2025-07-04 ENCOUNTER — Ambulatory Visit

## 2025-09-17 ENCOUNTER — Encounter

## 2025-10-03 ENCOUNTER — Ambulatory Visit

## 2025-12-17 ENCOUNTER — Encounter
